# Patient Record
Sex: Male | Born: 1995
Health system: Southern US, Community
[De-identification: ages and names within clinical notes are randomized; demographics above are authoritative.]

## PROBLEM LIST (undated history)

## (undated) DIAGNOSIS — T7840XA Allergy, unspecified, initial encounter: Secondary | ICD-10-CM

## (undated) HISTORY — PX: HERNIA REPAIR: SHX51

## (undated) HISTORY — DX: Allergy, unspecified, initial encounter: T78.40XA

---
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
## 2015-05-19 ENCOUNTER — Other Ambulatory Visit (INDEPENDENT_AMBULATORY_CARE_PROVIDER_SITE_OTHER): Payer: 59

## 2015-05-19 ENCOUNTER — Encounter: Payer: Self-pay | Admitting: Internal Medicine

## 2015-05-19 ENCOUNTER — Ambulatory Visit (INDEPENDENT_AMBULATORY_CARE_PROVIDER_SITE_OTHER): Payer: 59 | Admitting: Internal Medicine

## 2015-05-19 VITALS — BP 118/58 | HR 76 | Temp 97.4°F | Resp 16 | Ht 69.0 in | Wt 150.0 lb

## 2015-05-19 DIAGNOSIS — R591 Generalized enlarged lymph nodes: Secondary | ICD-10-CM | POA: Insufficient documentation

## 2015-05-19 DIAGNOSIS — Z23 Encounter for immunization: Secondary | ICD-10-CM

## 2015-05-19 DIAGNOSIS — Z111 Encounter for screening for respiratory tuberculosis: Secondary | ICD-10-CM | POA: Diagnosis not present

## 2015-05-19 DIAGNOSIS — Z0000 Encounter for general adult medical examination without abnormal findings: Secondary | ICD-10-CM

## 2015-05-19 LAB — CBC WITH DIFFERENTIAL/PLATELET
BASOS PCT: 0.2 % (ref 0.0–3.0)
Basophils Absolute: 0 10*3/uL (ref 0.0–0.1)
EOS ABS: 0.1 10*3/uL (ref 0.0–0.7)
EOS PCT: 1.6 % (ref 0.0–5.0)
HEMATOCRIT: 48.6 % (ref 39.0–52.0)
Hemoglobin: 16.6 g/dL (ref 13.0–17.0)
Lymphocytes Relative: 19.7 % (ref 12.0–46.0)
Lymphs Abs: 1.8 10*3/uL (ref 0.7–4.0)
MCHC: 34.1 g/dL (ref 30.0–36.0)
MCV: 92 fl (ref 78.0–100.0)
MONO ABS: 0.8 10*3/uL (ref 0.1–1.0)
Monocytes Relative: 8.7 % (ref 3.0–12.0)
NEUTROS ABS: 6.2 10*3/uL (ref 1.4–7.7)
Neutrophils Relative %: 69.8 % (ref 43.0–77.0)
PLATELETS: 154 10*3/uL (ref 150.0–400.0)
RBC: 5.28 Mil/uL (ref 4.22–5.81)
RDW: 12.9 % (ref 11.5–15.5)
WBC: 8.9 10*3/uL (ref 4.0–10.5)

## 2015-05-19 LAB — COMPREHENSIVE METABOLIC PANEL
ALBUMIN: 4.6 g/dL (ref 3.5–5.2)
ALT: 13 U/L (ref 0–53)
AST: 17 U/L (ref 0–37)
Alkaline Phosphatase: 69 U/L (ref 39–117)
BUN: 15 mg/dL (ref 6–23)
CHLORIDE: 102 meq/L (ref 96–112)
CO2: 30 meq/L (ref 19–32)
CREATININE: 1.17 mg/dL (ref 0.40–1.50)
Calcium: 9.8 mg/dL (ref 8.4–10.5)
GFR: 83.38 mL/min (ref >60.00)
Glucose, Bld: 100 mg/dL — ABNORMAL HIGH (ref 70–99)
Potassium: 4 mEq/L (ref 3.5–5.1)
SODIUM: 139 meq/L (ref 135–145)
Total Bilirubin: 0.9 mg/dL (ref 0.2–1.2)
Total Protein: 7 g/dL (ref 6.0–8.3)

## 2015-05-19 LAB — URINALYSIS, ROUTINE W REFLEX MICROSCOPIC
Bilirubin Urine: NEGATIVE
HGB URINE DIPSTICK: NEGATIVE
Ketones, ur: NEGATIVE
Leukocytes, UA: NEGATIVE
NITRITE: NEGATIVE
RBC / HPF: NONE SEEN (ref 0–?)
Total Protein, Urine: NEGATIVE
URINE GLUCOSE: NEGATIVE
Urobilinogen, UA: 0.2 (ref 0.0–1.0)
WBC, UA: NONE SEEN (ref 0–?)
pH: 6 (ref 5.0–8.0)

## 2015-05-19 LAB — LIPID PANEL
CHOLESTEROL: 169 mg/dL (ref 0–200)
HDL: 60.4 mg/dL (ref >39.00)
LDL CALC: 82 mg/dL (ref 0–99)
NonHDL: 108.81
Total CHOL/HDL Ratio: 3
Triglycerides: 133 mg/dL (ref 0.0–149.0)
VLDL: 26.6 mg/dL (ref 0.0–40.0)

## 2015-05-19 LAB — SEDIMENTATION RATE: SED RATE: 5 mm/h (ref 0–22)

## 2015-05-19 LAB — TSH: TSH: 4.52 u[IU]/mL — AB (ref 0.35–4.50)

## 2015-05-19 NOTE — Patient Instructions (Signed)

## 2015-05-19 NOTE — Progress Notes (Signed)
Subjective:  Patient ID: [name], male    DOB: Aug 01, 1995  Age: 19 y.o. MRN: [id number]  CC: Annual Exam   HPI DANYAL WHITENACK presents for a CPX - he feels well and offers no complaints.  No outpatient prescriptions prior to visit.   No facility-administered medications prior to visit.    ROS Review of Systems  Constitutional: Negative.  Negative for fever, chills, diaphoresis, appetite change and fatigue.  HENT: Negative.  Negative for sore throat and trouble swallowing.   Eyes: Negative.   Respiratory: Negative.  Negative for cough, choking, chest tightness, shortness of breath and stridor.   Cardiovascular: Negative.  Negative for chest pain, palpitations and leg swelling.  Gastrointestinal: Negative.  Negative for nausea, vomiting, abdominal pain, diarrhea, constipation and blood in stool.  Endocrine: Negative.   Genitourinary: Negative.  Negative for dysuria, urgency, frequency, hematuria, flank pain, decreased urine volume, enuresis, difficulty urinating, genital sores, penile pain and testicular pain.  Musculoskeletal: Negative.  Negative for myalgias, back pain, joint swelling and arthralgias.  Skin: Negative.  Negative for rash.  Allergic/Immunologic: Negative.   Neurological: Negative.  Negative for dizziness, tremors, syncope, light-headedness, numbness and headaches.  Hematological: Negative.  Negative for adenopathy. Does not bruise/bleed easily.  Psychiatric/Behavioral: Negative.     Objective:  BP 118/58 mmHg  Pulse 76  Temp(Src) 97.4 F (36.3 C) (Oral)  Resp 16  Ht  (1.753 m)  Wt 150 lb (68.04 kg)  BMI 22.14 kg/m2  SpO2 98%  BP Readings from Last 3 Encounters:  05/19/15 118/58    Wt Readings from Last 3 Encounters:  05/19/15 150 lb (68.04 kg)    Physical Exam  Constitutional: He is oriented to person, place, and time. No distress.  HENT:  Head: Normocephalic and atraumatic.  Mouth/Throat: Oropharynx is clear and moist. No  oropharyngeal exudate.  Eyes: Conjunctivae are normal. Right eye exhibits no discharge. Left eye exhibits no discharge. No scleral icterus.  Neck: Normal range of motion. Neck supple. No JVD present. No tracheal deviation present. No thyromegaly present.  Cardiovascular: Normal rate, regular rhythm, normal heart sounds and intact distal pulses.  Exam reveals no gallop and no friction rub.   No murmur heard. Pulmonary/Chest: Effort normal and breath sounds normal. No stridor. No respiratory distress. He has no wheezes. He has no rales. He exhibits no tenderness.  Abdominal: Soft. Bowel sounds are normal. He exhibits no distension and no mass. There is no tenderness. There is no rebound and no guarding. Hernia confirmed negative in the right inguinal area and confirmed negative in the left inguinal area.  Genitourinary: Testes normal and penis normal. Right testis shows no mass, no swelling and no tenderness. Right testis is descended. Left testis shows no mass, no swelling and no tenderness. Left testis is descended. Circumcised. No penile erythema or penile tenderness. No discharge found.  Musculoskeletal: Normal range of motion. He exhibits no edema or tenderness.  Lymphadenopathy:       Head (right side): No submental, no submandibular, no tonsillar, no preauricular, no posterior auricular and no occipital adenopathy present.       Head (left side): No submental, no submandibular, no tonsillar, no preauricular, no posterior auricular and no occipital adenopathy present.    He has no cervical adenopathy.       Right cervical: No superficial cervical, no deep cervical and no posterior cervical adenopathy present.      Left cervical: No superficial cervical, no deep cervical and no posterior cervical  adenopathy present.    He has axillary adenopathy.       Right axillary: Lateral adenopathy present. No pectoral adenopathy present.       Left axillary: No pectoral and no lateral adenopathy present.       Right: Inguinal adenopathy present. No supraclavicular and no epitrochlear adenopathy present.       Left: Inguinal adenopathy present. No supraclavicular and no epitrochlear adenopathy present.  In the right axilla there is a 1.5 cm round, firm, freely mobile lymph node that is non-tender and non-fixed.  There is bilateral "shoddy" LAD in the bilateral inguinal areas that measures about 1/2 cm and is freely mobile and non-tender.  Neurological: He is oriented to person, place, and time.  Skin: Skin is warm and dry. No rash noted. He is not diaphoretic. No erythema. No pallor.  Vitals reviewed.   Lab Results  Component Value Date   WBC 8.9 05/19/2015   HGB 16.6 05/19/2015   HCT 48.6 05/19/2015   PLT 154.0 05/19/2015   GLUCOSE 100* 05/19/2015   CHOL 169 05/19/2015   TRIG 133.0 05/19/2015   HDL 60.40 05/19/2015   LDLCALC 82 05/19/2015   ALT 13 05/19/2015   AST 17 05/19/2015   NA 139 05/19/2015   K 4.0 05/19/2015   CL 102 05/19/2015   CREATININE 1.17 05/19/2015   BUN 15 05/19/2015   CO2 30 05/19/2015   TSH 4.52* 05/19/2015    Ct Pelvis W Contrast  08/12/2008  Clinical Data:  Abdominal pain, pressure on urination, history of ruptured appendicitis as well as hernia repair  CT ABDOMEN AND PELVIS WITH CONTRAST  Technique: Multidetector CT imaging of the abdomen and pelvis was performed using the standard protocol following bolus administration of intravenous contrast.  Contrast: 100 ml Optiray 358  Comparison: Chest and abdomen films of 05/16/2006  CT ABDOMEN  Findings:  The lung bases are clear.  The liver enhances with no focal abnormality and no ductal dilatation is seen.  No calcified gallstones are noted.  The stomach is somewhat distended by fluid, a finding of questionable significance.  The pancreas is normal in size and the pancreatic duct is not dilated.  The adrenal glands appear normal and the spleen is minimally prominent.  The kidneys enhance with no evidence of calculi or  hydronephrosis.  The abdominal aorta is normal in caliber.  There is a moderate amount of feces throughout the entire colon.  IMPRESSION:  1.  No acute abnormality on CT of the abdomen. 2.  Fluid-distended stomach of questionable significance. 3.  The spleen is within upper limits of normal. 4.  Moderate amount of feces throughout the entire colon.  CT PELVIS  Findings:  Again there is a moderate amount of feces throughout the colon.  The urinary bladder is somewhat urine distended.  No intraluminal bladder lesion is seen.  Surgical clips lie just posterior to the urinary bladder between the rectum and the bladder.  No pelvic mass, fluid, or adenopathy is seen.  The terminal ileum appears normal.  Surgical clips are noted near the base of the cecum secondary to prior appendectomy.  IMPRESSION:  1.  Moderate amount of feces throughout the colon. 2.  The urinary bladder is somewhat distended. 3.  No acute abnormality.  Dr. Caleen Jobs was in surgery at the time of interpretation, and this study will be discussed after his completion of that surgery. Provider: Lanny Hurst, Elizabeth Soots  Ct Abdomen W Contrast  08/12/2008  Clinical Data:  Abdominal  pain, pressure on urination, history of ruptured appendicitis as well as hernia repair  CT ABDOMEN AND PELVIS WITH CONTRAST  Technique: Multidetector CT imaging of the abdomen and pelvis was performed using the standard protocol following bolus administration of intravenous contrast.  Contrast: 100 ml Optiray 358  Comparison: Chest and abdomen films of 05/16/2006  CT ABDOMEN  Findings:  The lung bases are clear.  The liver enhances with no focal abnormality and no ductal dilatation is seen.  No calcified gallstones are noted.  The stomach is somewhat distended by fluid, a finding of questionable significance.  The pancreas is normal in size and the pancreatic duct is not dilated.  The adrenal glands appear normal and the spleen is minimally prominent.  The kidneys enhance  with no evidence of calculi or hydronephrosis.  The abdominal aorta is normal in caliber.  There is a moderate amount of feces throughout the entire colon.  IMPRESSION:  1.  No acute abnormality on CT of the abdomen. 2.  Fluid-distended stomach of questionable significance. 3.  The spleen is within upper limits of normal. 4.  Moderate amount of feces throughout the entire colon.  CT PELVIS  Findings:  Again there is a moderate amount of feces throughout the colon.  The urinary bladder is somewhat urine distended.  No intraluminal bladder lesion is seen.  Surgical clips lie just posterior to the urinary bladder between the rectum and the bladder.  No pelvic mass, fluid, or adenopathy is seen.  The terminal ileum appears normal.  Surgical clips are noted near the base of the cecum secondary to prior appendectomy.  IMPRESSION:  1.  Moderate amount of feces throughout the colon. 2.  The urinary bladder is somewhat distended. 3.  No acute abnormality.  Dr. Caleen JobsFarouqi was in surgery at the time of interpretation, and this study will be discussed after his completion of that surgery. Provider: Lanny HurstMegan Smith, Gaynelle CageElizabeth Soots   Assessment & Plan:   Chrissie NoaWilliam was seen today for annual exam.  Diagnoses and all orders for this visit:  Lymphadenopathy, generalized- he has no signs or symptoms related to this and the lymph nodes feel benign. Will check a CBC, sedimentation rate, serum protein electrophoresis and HIV antibody level to see if there is concern for infection and/or lymphoproliferative disease. -     Sedimentation rate; Future -     SPEP & IFE with QIG; Future -     HIV antibody; Future  Routine general medical examination at a health care facility- exam done, TB test done at his request, vaccines were reviewed and updated, labs ordered, pt ed material was given -     Lipid panel; Future -     Comprehensive metabolic panel; Future -     CBC with Differential/Platelet; Future -     TSH; Future -      Urinalysis, Routine w reflex microscopic (not at [facility]); Future  Screening for tuberculosis -     PPD  Need for influenza vaccination -     Flu Vaccine QUAD 36+ mos IM   I am having Mr. Rigel maintain his loratadine.  Meds ordered this encounter  Medications  . loratadine (CLARITIN) 10 MG tablet    Sig: Take 10 mg by mouth daily as needed for allergies.     Follow-up: Return in about 4 weeks (around 06/16/2015).  Sanda Lingerhomas Yarianna Varble, MD

## 2015-05-20 ENCOUNTER — Encounter: Payer: Self-pay | Admitting: Internal Medicine

## 2015-05-20 LAB — HIV ANTIBODY (ROUTINE TESTING W REFLEX): HIV: NONREACTIVE

## 2015-05-21 ENCOUNTER — Encounter: Payer: Self-pay | Admitting: Internal Medicine

## 2015-05-21 ENCOUNTER — Telehealth: Payer: Self-pay

## 2015-05-21 LAB — TB SKIN TEST
INDURATION: 0 mm
TB SKIN TEST: NEGATIVE

## 2015-05-21 LAB — SPEP & IFE WITH QIG
ALBUMIN ELP: 5.2 g/dL — AB (ref 3.8–4.8)
ALPHA-1-GLOBULIN: 0.3 g/dL (ref 0.2–0.3)
Alpha-2-Globulin: 0.6 g/dL (ref 0.5–0.9)
BETA 2: 0.3 g/dL (ref 0.2–0.5)
BETA GLOBULIN: 0.4 g/dL (ref 0.4–0.6)
GAMMA GLOBULIN: 1 g/dL (ref 0.8–1.7)
IGG (IMMUNOGLOBIN G), SERUM: 1110 mg/dL (ref 650–1600)
IgA: 212 mg/dL (ref 68–379)
IgM, Serum: 46 mg/dL (ref 41–251)
Total Protein, Serum Electrophoresis: 7.7 g/dL (ref 6.1–8.1)

## 2015-06-30 NOTE — Telephone Encounter (Signed)
Erroneous

## 2015-08-14 ENCOUNTER — Encounter: Payer: Self-pay | Admitting: Internal Medicine

## 2015-08-14 ENCOUNTER — Ambulatory Visit (INDEPENDENT_AMBULATORY_CARE_PROVIDER_SITE_OTHER): Payer: 59 | Admitting: Internal Medicine

## 2015-08-14 VITALS — BP 120/60 | HR 75 | Temp 98.6°F | Resp 16 | Ht 68.0 in | Wt 151.0 lb

## 2015-08-14 DIAGNOSIS — B349 Viral infection, unspecified: Secondary | ICD-10-CM | POA: Diagnosis not present

## 2015-08-14 MED ORDER — FLUTICASONE PROPIONATE 50 MCG/ACT NA SUSP: 2.0000 | Freq: Every day | NASAL | Status: DC

## 2015-08-14 NOTE — Patient Instructions (Signed)
We have sent in flonase which is a nose spray that helps to dry up the sinuses. Use 2 sprays in each nostril once a day. It may take 2-3 days to kick in to full effect.  Keep using the ibuprofen or tylenol as needed for any fevers.   We have given you a note for work for tomorrow but you should be able to go Saturday.  Upper Respiratory Infection, Adult Most upper respiratory infections (URIs) are a viral infection of the air passages leading to the lungs. A URI affects the nose, throat, and upper air passages. The most common type of URI is nasopharyngitis and is typically referred to as "the common cold." URIs run their course and usually go away on their own. Most of the time, a URI does not require medical attention, but sometimes a bacterial infection in the upper airways can follow a viral infection. This is called a secondary infection. Sinus and middle ear infections are common types of secondary upper respiratory infections. Bacterial pneumonia can also complicate a URI. A URI can worsen asthma and chronic obstructive pulmonary disease (COPD). Sometimes, these complications can require emergency medical care and may be life threatening.  CAUSES Almost all URIs are caused by viruses. A virus is a type of germ and can spread from one person to another.  RISKS FACTORS You may be at risk for a URI if:   You smoke.   You have chronic heart or lung disease.  You have a weakened defense (immune) system.   You are very young or very old.   You have nasal allergies or asthma.  You work in crowded or poorly ventilated areas.  You work in health care facilities or schools. SIGNS AND SYMPTOMS  Symptoms typically develop 2-3 days after you come in contact with a cold virus. Most viral URIs last 7-10 days. However, viral URIs from the influenza virus (flu virus) can last 14-18 days and are typically more severe. Symptoms may include:   Runny or stuffy (congested) nose.   Sneezing.    Cough.   Sore throat.   Headache.   Fatigue.   Fever.   Loss of appetite.   Pain in your forehead, behind your eyes, and over your cheekbones (sinus pain).  Muscle aches.  DIAGNOSIS  Your health care provider may diagnose a URI by:  Physical exam.  Tests to check that your symptoms are not due to another condition such as:  Strep throat.  Sinusitis.  Pneumonia.  Asthma. TREATMENT  A URI goes away on its own with time. It cannot be cured with medicines, but medicines may be prescribed or recommended to relieve symptoms. Medicines may help:  Reduce your fever.  Reduce your cough.  Relieve nasal congestion. HOME CARE INSTRUCTIONS   Take medicines only as directed by your health care provider.   Gargle warm saltwater or take cough drops to comfort your throat as directed by your health care provider.  Use a warm mist humidifier or inhale steam from a shower to increase air moisture. This may make it easier to breathe.  Drink enough fluid to keep your urine clear or pale yellow.   Eat soups and other clear broths and maintain good nutrition.   Rest as needed.   Return to work when your temperature has returned to normal or as your health care provider advises. You may need to stay home longer to avoid infecting others. You can also use a face mask and careful hand washing  to prevent spread of the virus.  Increase the usage of your inhaler if you have asthma.   Do not use any tobacco products, including cigarettes, chewing tobacco, or electronic cigarettes. If you need help quitting, ask your health care provider. PREVENTION  The best way to protect yourself from getting a cold is to practice good hygiene.   Avoid oral or hand contact with people with cold symptoms.   Wash your hands often if contact occurs.  There is no clear evidence that vitamin C, vitamin E, echinacea, or exercise reduces the chance of developing a cold. However, it is  always recommended to get plenty of rest, exercise, and practice good nutrition.  SEEK MEDICAL CARE IF:   You are getting worse rather than better.   Your symptoms are not controlled by medicine.   You have chills.  You have worsening shortness of breath.  You have brown or red mucus.  You have yellow or brown nasal discharge.  You have pain in your face, especially when you bend forward.  You have a fever.  You have swollen neck glands.  You have pain while swallowing.  You have white areas in the back of your throat. SEEK IMMEDIATE MEDICAL CARE IF:   You have severe or persistent:  Headache.  Ear pain.  Sinus pain.  Chest pain.  You have chronic lung disease and any of the following:  Wheezing.  Prolonged cough.  Coughing up blood.  A change in your usual mucus.  You have a stiff neck.  You have changes in your:  Vision.  Hearing.  Thinking.  Mood. MAKE SURE YOU:   Understand these instructions.  Will watch your condition.  Will get help right away if you are not doing well or get worse.   This information is not intended to replace advice given to you by your health care provider. Make sure you discuss any questions you have with your health care provider.   Document Released: 12/22/2000 Document Revised: 11/12/2014 Document Reviewed: 10/03/2013 Elsevier Interactive Patient Education [employer]

## 2015-08-14 NOTE — Progress Notes (Signed)
Pre visit review using our clinic review tool, if applicable. No additional management support is needed unless otherwise documented below in the visit note. 

## 2015-08-15 ENCOUNTER — Encounter: Payer: Self-pay | Admitting: Internal Medicine

## 2015-08-15 DIAGNOSIS — B349 Viral infection, unspecified: Secondary | ICD-10-CM | POA: Insufficient documentation

## 2015-08-15 NOTE — Assessment & Plan Note (Signed)
Rx for flonase to help with the nasal symptoms. He is okay to use sudafed. Note for work to miss Friday shift. If no more fevers can go to work on Saturday. No antibiotics indicated and not typical for influenza.

## 2015-08-15 NOTE — Progress Notes (Signed)
   Subjective:    Patient ID: Harold Nichols, male    DOB: 08-22-1995, 20 y.o.   MRN: [id number]  HPI The patient is a 20 YO man coming in for viral symptoms. He has had some fevers over the last week. Treating with ibuprofen. Denies any muscle aches or fatigue. Is slightly tired. Some sinus congestion which he has treated with sudafed. Some improvement there. Mild cough but no SOB and no sputum. No sinus pressure or headache. Has not tried any other medicines.   Review of Systems  Constitutional: Positive for fever, chills and activity change. Negative for appetite change, fatigue and unexpected weight change.  HENT: Positive for congestion and rhinorrhea. Negative for ear discharge, ear pain, postnasal drip, sinus pressure, sore throat and trouble swallowing.   Eyes: Negative.   Respiratory: Positive for cough. Negative for chest tightness, shortness of breath and wheezing.   Cardiovascular: Negative for chest pain, palpitations and leg swelling.  Gastrointestinal: Negative.   Musculoskeletal: Negative.   Neurological: Negative.       Objective:   Physical Exam  Constitutional: He appears well-developed and well-nourished.  HENT:  Head: Normocephalic and atraumatic.  TMs normal, throat with mild erythema and clear drainage, nasal turbinates without crusting.  Eyes: EOM are normal.  Neck: Normal range of motion.  Cardiovascular: Normal rate and regular rhythm.   Pulmonary/Chest: Effort normal and breath sounds normal. No respiratory distress. He has no wheezes. He has no rales.  Abdominal: Soft.  Neurological: Coordination normal.  Skin: Skin is warm and dry.   Filed Vitals:   08/14/15 1614  BP: 120/60  Pulse: 75  Temp: 98.6 F (37 C)  TempSrc: Oral  Resp: 16  Height:  (1.727 m)  Weight: 151 lb (68.493 kg)  SpO2: 98%      Assessment & Plan:

## 2016-07-01 ENCOUNTER — Encounter: Payer: Self-pay | Admitting: Internal Medicine

## 2016-07-01 ENCOUNTER — Ambulatory Visit (INDEPENDENT_AMBULATORY_CARE_PROVIDER_SITE_OTHER): Payer: 59 | Admitting: Internal Medicine

## 2016-07-01 VITALS — BP 115/56 | HR 86 | Temp 98.4°F | Resp 16 | Ht 69.0 in | Wt 150.0 lb

## 2016-07-01 DIAGNOSIS — Z23 Encounter for immunization: Secondary | ICD-10-CM

## 2016-07-01 DIAGNOSIS — M7652 Patellar tendinitis, left knee: Secondary | ICD-10-CM | POA: Diagnosis not present

## 2016-07-01 MED ORDER — DICLOFENAC POTASSIUM 25 MG PO CAPS: 1.0000 | ORAL_CAPSULE | Freq: Three times a day (TID) | ORAL | 0 refills | Status: DC

## 2016-07-01 NOTE — Progress Notes (Signed)
Pre visit review using our clinic review tool, if applicable. No additional management support is needed unless otherwise documented below in the visit note. 

## 2016-07-01 NOTE — Patient Instructions (Signed)
Patellar Tendinitis Patellar tendinitis, also called jumper's knee, is inflammation of the patellar tendon. Tendons are cord-like tissues that connect muscles to bones. The patellar tendon connects the bottom of the kneecap (patella) to the top of the shin bone (tibia). Patellar tendinitis causes pain in the front of the knee. The condition happens in the following stages:  Stage 1. In this stage, there is pain only after activity.  Stage 2: In this stage, you have pain during and after activity.  Stage 3: In this stage, you have pain during and after activity that limits your ability to do the activity.  Stage 4: In this stage, the tendon tears and severely limits your activity. What are the causes? This condition is caused by repeated (repetitive) stress on the tendon. This stress may cause the tendon to stretch, swell, thicken, or tear. What increases the risk? The following factors make you more likely to develop this condition:  Participating in sports that involve running, kicking and jumping, especially on hard surfaces. These include:  Basketball.  Volleyball.  Soccer.  Track and field.  Having tight thigh muscles.  Having received steroid injections in the tendon.  Having had knee surgery.  Being 16-40 years old.  Having rheumatoid arthritis or diabetes.  Training too hard. What are the signs or symptoms? The main symptom of this condition is pain in the front of the knee. The pain usually starts slowly then it gradually gets worse. It may become painful to straighten your leg. How is this diagnosed? This condition may be diagnosed based on:  Your symptoms.  A medical history.  A physical exam. During the physical exam, your health care provider may check for tenderness in your patella, tightness in your thigh muscles, and pain when you straighten your knee.  Imaging tests, including:  X-rays. These will show the position and condition of your  patella.  MRI. This will show any tears in your tendon.  Ultrasound. This will show any swelling in your tendon and the thickness of your tendon. How is this treated? Treatment for this condition depends on the stage of the condition. It may involve:  Avoiding activities that cause pain.  Icing your knee.  Taking an NSAID to reduce pain and swelling.  Doing stretching and strengthening exercises (physical therapy) when pain and swelling improve.  Having sound wave stimulation to promote healing.  Wearing a knee brace. This may be needed if your condition does not improve with treatment.  Using crutches or a walker. This may be needed if your condition does not improve with treatment.  Surgery. This may be done if you have stage 4 tendinitis. Follow these instructions at home: If You Have a Knee Brace:  Wear it as told by your health care provider. Remove it only as told by your health care provider.  Loosen the brace if your toes become numb and tingle, or if they turn cold and blue.  Do not let your brace get wet if it is not waterproof.  Keep the brace clean.  Ask your health care provider when it is safe for you to drive. Managing pain, stiffness, and swelling  Take over-the-counter and prescription medicines only as told by your health care provider.  If directed, apply ice to the injured area.  Put ice in a plastic bag.  Place a towel between your skin and the bag.  Leave the ice on for 20 minutes, 2-3 times a day.  Move your toes often to avoid stiffness   and to lessen swelling.  Raise (elevate) your knee above the level of your heart while you are sitting or lying down. Activity  Return to your normal activities as told by your health care provider. Ask your health care provider what activities are safe for you.  Do exercises as told by your health care provider. General instructions  Do not use the injured limb to support your body weight until your  health care provider says that you can. Use your crutches or walker as told by your health care provider.  Keep all follow-up visits as told by your health care provider. This is important. How is this prevented?  Warm up and stretch before being active.  Cool down and stretch after being active.  Give your body time to rest between periods of activity.  Make sure to use equipment that fits you.  Be safe and responsible while being active to avoid falls.  Do at least 150 minutes of moderate-intensity exercise each week, such as brisk walking or water aerobics.  Maintain physical fitness, including:  Strength.  Flexibility.  Cardiovascular fitness.  Endurance. Contact a health care provider if:  Your symptoms have not improve in 6 weeks.  Your symptoms get worse. This information is not intended to replace advice given to you by your health care provider. Make sure you discuss any questions you have with your health care provider. Document Released: 06/28/2005 Document Revised: 03/02/2016 Document Reviewed: 04/01/2015 Elsevier Interactive Patient Education  2017 [employer]  

## 2016-07-01 NOTE — Progress Notes (Signed)
Subjective:  Patient ID: Harold Nichols, male    DOB: 1995-12-25  Age: 20 y.o. MRN: [id number]  CC: Knee Pain   HPI MIKOLAJ WOOLSTENHULME presents for worsening knee pain over the last few weeks with no history of trauma or injury and no repetitive activities. He works as a Airline pilot. He complains of diffuse discomfort around his kneecap when he is climbing steps. He has not noticed any swelling, locking, clicking, instability, decreased range of motion, or redness. He is taking over-the-counter ibuprofen and has achieved moderate symptom relief.  Outpatient Medications Prior to Visit  Medication Sig Dispense Refill  . fluticasone (FLONASE) 50 MCG/ACT nasal spray Place 2 sprays into both nostrils daily. 16 g 6  . loratadine (CLARITIN) 10 MG tablet Take 10 mg by mouth daily as needed for allergies. Reported on 08/14/2015     No facility-administered medications prior to visit.     ROS Review of Systems  Musculoskeletal: Positive for arthralgias.  All other systems reviewed and are negative.   Objective:  BP (!) 115/56 (BP Location: Left Arm, Patient Position: Sitting, Cuff Size: Normal)   Pulse 86   Temp 98.4 F (36.9 C) (Oral)   Resp 16   Ht 5\' 9"  (1.753 m)   Wt 150 lb (68 kg)   SpO2 99%   BMI 22.15 kg/m   BP Readings from Last 3 Encounters:  07/01/16 (!) 115/56  08/14/15 120/60  05/19/15 (!) 118/58    Wt Readings from Last 3 Encounters:  07/01/16 150 lb (68 kg)  08/14/15 151 lb (68.5 kg)  05/19/15 150 lb (68 kg)    Physical Exam  Musculoskeletal:       Left knee: He exhibits normal range of motion, no swelling, no effusion, no ecchymosis, no deformity, no laceration, no erythema, normal alignment, no LCL laxity, no bony tenderness and normal meniscus. Tenderness found. Patellar tendon tenderness noted. No medial joint line, no lateral joint line, no MCL and no LCL tenderness noted.    Lab Results  Component Value Date   WBC 8.9 05/19/2015   HGB 16.6 05/19/2015   HCT  48.6 05/19/2015   PLT 154.0 05/19/2015   GLUCOSE 100 (H) 05/19/2015   CHOL 169 05/19/2015   TRIG 133.0 05/19/2015   HDL 60.40 05/19/2015   LDLCALC 82 05/19/2015   ALT 13 05/19/2015   AST 17 05/19/2015   NA 139 05/19/2015   K 4.0 05/19/2015   CL 102 05/19/2015   CREATININE 1.17 05/19/2015   BUN 15 05/19/2015   CO2 30 05/19/2015   TSH 4.52 (H) 05/19/2015    Ct Pelvis W Contrast  Result Date: 08/12/2008 Clinical Data:  Abdominal pain, pressure on urination, history of ruptured appendicitis as well as hernia repair  CT ABDOMEN AND PELVIS WITH CONTRAST  Technique: Multidetector CT imaging of the abdomen and pelvis was performed using the standard protocol following bolus administration of intravenous contrast.  Contrast: 100 ml Optiray 358  Comparison: Chest and abdomen films of 05/16/2006  CT ABDOMEN  Findings:  The lung bases are clear.  The liver enhances with no focal abnormality and no ductal dilatation is seen.  No calcified gallstones are noted.  The stomach is somewhat distended by fluid, a finding of questionable significance.  The pancreas is normal in size and the pancreatic duct is not dilated.  The adrenal glands appear normal and the spleen is minimally prominent.  The kidneys enhance with no evidence of calculi or hydronephrosis.  The abdominal aorta  is normal in caliber.  There is a moderate amount of feces throughout the entire colon.  IMPRESSION:  1.  No acute abnormality on CT of the abdomen. 2.  Fluid-distended stomach of questionable significance. 3.  The spleen is within upper limits of normal. 4.  Moderate amount of feces throughout the entire colon.  CT PELVIS  Findings:  Again there is a moderate amount of feces throughout the colon.  The urinary bladder is somewhat urine distended.  No intraluminal bladder lesion is seen.  Surgical clips lie just posterior to the urinary bladder between the rectum and the bladder.  No pelvic mass, fluid, or adenopathy is seen.  The terminal  ileum appears normal.  Surgical clips are noted near the base of the cecum secondary to prior appendectomy.  IMPRESSION:  1.  Moderate amount of feces throughout the colon. 2.  The urinary bladder is somewhat distended. 3.  No acute abnormality.  Dr. Caleen JobsFarouqi was in surgery at the time of interpretation, and this study will be discussed after his completion of that surgery. Provider: Lanny HurstMegan Smith, Gaynelle CageElizabeth Soots  Ct Abdomen W Contrast  Result Date: 08/12/2008 Clinical Data:  Abdominal pain, pressure on urination, history of ruptured appendicitis as well as hernia repair  CT ABDOMEN AND PELVIS WITH CONTRAST  Technique: Multidetector CT imaging of the abdomen and pelvis was performed using the standard protocol following bolus administration of intravenous contrast.  Contrast: 100 ml Optiray 358  Comparison: Chest and abdomen films of 05/16/2006  CT ABDOMEN  Findings:  The lung bases are clear.  The liver enhances with no focal abnormality and no ductal dilatation is seen.  No calcified gallstones are noted.  The stomach is somewhat distended by fluid, a finding of questionable significance.  The pancreas is normal in size and the pancreatic duct is not dilated.  The adrenal glands appear normal and the spleen is minimally prominent.  The kidneys enhance with no evidence of calculi or hydronephrosis.  The abdominal aorta is normal in caliber.  There is a moderate amount of feces throughout the entire colon.  IMPRESSION:  1.  No acute abnormality on CT of the abdomen. 2.  Fluid-distended stomach of questionable significance. 3.  The spleen is within upper limits of normal. 4.  Moderate amount of feces throughout the entire colon.  CT PELVIS  Findings:  Again there is a moderate amount of feces throughout the colon.  The urinary bladder is somewhat urine distended.  No intraluminal bladder lesion is seen.  Surgical clips lie just posterior to the urinary bladder between the rectum and the bladder.  No pelvic mass,  fluid, or adenopathy is seen.  The terminal ileum appears normal.  Surgical clips are noted near the base of the cecum secondary to prior appendectomy.  IMPRESSION:  1.  Moderate amount of feces throughout the colon. 2.  The urinary bladder is somewhat distended. 3.  No acute abnormality.  Dr. Caleen JobsFarouqi was in surgery at the time of interpretation, and this study will be discussed after his completion of that surgery. Provider: Lanny HurstMegan Smith, Gaynelle CageElizabeth Soots   Assessment & Plan:   Chrissie NoaWilliam was seen today for knee pain.  Diagnoses and all orders for this visit:  Need for prophylactic vaccination and inoculation against influenza -     Flu Vaccine QUAD 36+ mos PF IM (Fluarix & Fluzone Quad PF)  Patellar tendonitis of left knee- he was given a knee brace, will start prescription anti-inflammatories. He was given patient education regarding  exercise and other modalities to reduce the discomfort and strengthen his knee. -     Diclofenac Potassium (ZIPSOR) 25 MG CAPS; Take 1 capsule (25 mg total) by mouth 3 (three) times daily with meals.   I am having Mr. Heidelberger start on Diclofenac Potassium. I am also having him maintain his loratadine and fluticasone.  Meds ordered this encounter  Medications  . Diclofenac Potassium (ZIPSOR) 25 MG CAPS    Sig: Take 1 capsule (25 mg total) by mouth 3 (three) times daily with meals.    Dispense:  32 capsule    Refill:  0     Follow-up: Return in about 3 weeks (around 07/22/2016).  Sanda Lingerhomas Aleksis Jiggetts, MD

## 2016-07-22 ENCOUNTER — Ambulatory Visit: Payer: 59 | Admitting: Internal Medicine

## 2016-11-02 ENCOUNTER — Encounter: Payer: Self-pay | Admitting: Internal Medicine

## 2016-11-02 ENCOUNTER — Ambulatory Visit (INDEPENDENT_AMBULATORY_CARE_PROVIDER_SITE_OTHER): Payer: 59 | Admitting: Internal Medicine

## 2016-11-02 VITALS — BP 124/60 | HR 81 | Temp 98.0°F | Resp 16 | Ht 69.0 in | Wt 150.5 lb

## 2016-11-02 DIAGNOSIS — R7989 Other specified abnormal findings of blood chemistry: Secondary | ICD-10-CM

## 2016-11-02 DIAGNOSIS — Z111 Encounter for screening for respiratory tuberculosis: Secondary | ICD-10-CM | POA: Diagnosis not present

## 2016-11-02 DIAGNOSIS — Z0000 Encounter for general adult medical examination without abnormal findings: Secondary | ICD-10-CM

## 2016-11-02 NOTE — Progress Notes (Signed)
Pre visit review using our clinic review tool, if applicable. No additional management support is needed unless otherwise documented below in the visit note. 

## 2016-11-02 NOTE — Progress Notes (Signed)
Subjective:  Patient ID: Harold Nichols, male    DOB: 1996-01-03  Age: 21 y.o. MRN: [id number]  CC: Annual Exam   HPI Harold Nichols presents for a CPX - he has a hx of mildly elevated TSH but he feels well and offers no complaints.  Outpatient Medications Prior to Visit  Medication Sig Dispense Refill  . Diclofenac Potassium (ZIPSOR) 25 MG CAPS Take 1 capsule (25 mg total) by mouth 3 (three) times daily with meals. 32 capsule 0  . fluticasone (FLONASE) 50 MCG/ACT nasal spray Place 2 sprays into both nostrils daily. 16 g 6  . loratadine (CLARITIN) 10 MG tablet Take 10 mg by mouth daily as needed for allergies. Reported on 08/14/2015     No facility-administered medications prior to visit.     ROS Review of Systems  Constitutional: Negative.  Negative for activity change, appetite change, diaphoresis, fatigue and unexpected weight change.  HENT: Negative.   Eyes: Negative.   Respiratory: Negative.  Negative for cough, chest tightness, shortness of breath and wheezing.   Cardiovascular: Negative.  Negative for chest pain, palpitations and leg swelling.  Gastrointestinal: Negative for abdominal pain.  Endocrine: Negative for cold intolerance and heat intolerance.  Genitourinary: Negative for difficulty urinating, discharge, genital sores, penile swelling, scrotal swelling, testicular pain and urgency.  Musculoskeletal: Negative.  Negative for back pain and neck pain.  Skin: Negative.   Allergic/Immunologic: Negative.   Neurological: Negative.  Negative for dizziness, weakness and numbness.  Hematological: Negative for adenopathy. Does not bruise/bleed easily.  Psychiatric/Behavioral: Negative.     Objective:  BP 124/60 (BP Location: Left Arm, Patient Position: Sitting, Cuff Size: Normal)   Pulse 81   Temp 98 F (36.7 C) (Oral)   Resp 16   Ht  (1.753 m)   Wt 150 lb 8 oz (68.3 kg)   SpO2 100%   BMI 22.22 kg/m   BP Readings from Last 3 Encounters:  11/02/16 124/60    07/01/16 (!) 115/56  08/14/15 120/60    Wt Readings from Last 3 Encounters:  11/02/16 150 lb 8 oz (68.3 kg)  07/01/16 150 lb (68 kg)  08/14/15 151 lb (68.5 kg)    Physical Exam  Constitutional: He is oriented to person, place, and time. No distress.  HENT:  Mouth/Throat: Oropharynx is clear and moist. No oropharyngeal exudate.  Eyes: Conjunctivae are normal. Right eye exhibits no discharge. Left eye exhibits no discharge. No scleral icterus.  Neck: Normal range of motion. Neck supple. No JVD present. No tracheal deviation present. No thyromegaly present.  Cardiovascular: Normal rate, regular rhythm, normal heart sounds and intact distal pulses.  Exam reveals no gallop and no friction rub.   No murmur heard. Pulmonary/Chest: Effort normal and breath sounds normal. No stridor. No respiratory distress. He has no wheezes. He has no rales. He exhibits no tenderness.  Abdominal: Soft. Bowel sounds are normal. He exhibits no distension and no mass. There is no tenderness. There is no rebound and no guarding. Hernia confirmed negative in the right inguinal area and confirmed negative in the left inguinal area.  Genitourinary: Testes normal and penis normal. Right testis shows no mass, no swelling and no tenderness. Right testis is descended. Left testis shows no mass, no swelling and no tenderness. Left testis is descended. Circumcised. No penile erythema or penile tenderness. No discharge found.  Musculoskeletal: Normal range of motion. He exhibits no edema, tenderness or deformity.  Lymphadenopathy:    He has no cervical adenopathy.  Right: No inguinal adenopathy present.       Left: No inguinal adenopathy present.  Neurological: He is oriented to person, place, and time.  Skin: Skin is warm and dry. No rash noted. He is not diaphoretic. No erythema. No pallor.  Vitals reviewed.   Lab Results  Component Value Date   WBC 8.9 05/19/2015   HGB 16.6 05/19/2015   HCT 48.6 05/19/2015    PLT 154.0 05/19/2015   GLUCOSE 100 (H) 05/19/2015   CHOL 169 05/19/2015   TRIG 133.0 05/19/2015   HDL 60.40 05/19/2015   LDLCALC 82 05/19/2015   ALT 13 05/19/2015   AST 17 05/19/2015   NA 139 05/19/2015   K 4.0 05/19/2015   CL 102 05/19/2015   CREATININE 1.17 05/19/2015   BUN 15 05/19/2015   CO2 30 05/19/2015   TSH 4.52 (H) 05/19/2015    Ct Pelvis W Contrast  Result Date: 08/12/2008 Clinical Data:  Abdominal pain, pressure on urination, history of ruptured appendicitis as well as hernia repair  CT ABDOMEN AND PELVIS WITH CONTRAST  Technique: Multidetector CT imaging of the abdomen and pelvis was performed using the standard protocol following bolus administration of intravenous contrast.  Contrast: 100 ml Optiray 358  Comparison: Chest and abdomen films of 05/16/2006  CT ABDOMEN  Findings:  The lung bases are clear.  The liver enhances with no focal abnormality and no ductal dilatation is seen.  No calcified gallstones are noted.  The stomach is somewhat distended by fluid, a finding of questionable significance.  The pancreas is normal in size and the pancreatic duct is not dilated.  The adrenal glands appear normal and the spleen is minimally prominent.  The kidneys enhance with no evidence of calculi or hydronephrosis.  The abdominal aorta is normal in caliber.  There is a moderate amount of feces throughout the entire colon.  IMPRESSION:  1.  No acute abnormality on CT of the abdomen. 2.  Fluid-distended stomach of questionable significance. 3.  The spleen is within upper limits of normal. 4.  Moderate amount of feces throughout the entire colon.  CT PELVIS  Findings:  Again there is a moderate amount of feces throughout the colon.  The urinary bladder is somewhat urine distended.  No intraluminal bladder lesion is seen.  Surgical clips lie just posterior to the urinary bladder between the rectum and the bladder.  No pelvic mass, fluid, or adenopathy is seen.  The terminal ileum appears normal.   Surgical clips are noted near the base of the cecum secondary to prior appendectomy.  IMPRESSION:  1.  Moderate amount of feces throughout the colon. 2.  The urinary bladder is somewhat distended. 3.  No acute abnormality.  Dr. Caleen Jobs was in surgery at the time of interpretation, and this study will be discussed after his completion of that surgery. Provider: Lanny Hurst, Gaynelle Cage  Ct Abdomen W Contrast  Result Date: 08/12/2008 Clinical Data:  Abdominal pain, pressure on urination, history of ruptured appendicitis as well as hernia repair  CT ABDOMEN AND PELVIS WITH CONTRAST  Technique: Multidetector CT imaging of the abdomen and pelvis was performed using the standard protocol following bolus administration of intravenous contrast.  Contrast: 100 ml Optiray 358  Comparison: Chest and abdomen films of 05/16/2006  CT ABDOMEN  Findings:  The lung bases are clear.  The liver enhances with no focal abnormality and no ductal dilatation is seen.  No calcified gallstones are noted.  The stomach is somewhat distended by fluid,  a finding of questionable significance.  The pancreas is normal in size and the pancreatic duct is not dilated.  The adrenal glands appear normal and the spleen is minimally prominent.  The kidneys enhance with no evidence of calculi or hydronephrosis.  The abdominal aorta is normal in caliber.  There is a moderate amount of feces throughout the entire colon.  IMPRESSION:  1.  No acute abnormality on CT of the abdomen. 2.  Fluid-distended stomach of questionable significance. 3.  The spleen is within upper limits of normal. 4.  Moderate amount of feces throughout the entire colon.  CT PELVIS  Findings:  Again there is a moderate amount of feces throughout the colon.  The urinary bladder is somewhat urine distended.  No intraluminal bladder lesion is seen.  Surgical clips lie just posterior to the urinary bladder between the rectum and the bladder.  No pelvic mass, fluid, or adenopathy is  seen.  The terminal ileum appears normal.  Surgical clips are noted near the base of the cecum secondary to prior appendectomy.  IMPRESSION:  1.  Moderate amount of feces throughout the colon. 2.  The urinary bladder is somewhat distended. 3.  No acute abnormality.  Dr. Caleen Jobs was in surgery at the time of interpretation, and this study will be discussed after his completion of that surgery. Provider: Lanny Hurst, Gaynelle Cage   Assessment & Plan:   Harold was seen today for annual exam.  Diagnoses and all orders for this visit:  TSH elevation- he appears euthyroid, will monitor for thyroid disease -     Thyroid Panel With TSH; Future -     Thyroid peroxidase antibody; Future  Routine general medical examination at a health care facility- exam completed, labs ordered, vaccines reviewed, pt ed material was given -     Comprehensive metabolic panel; Future -     CBC with Differential/Platelet; Future -     Lipid panel; Future  Screening-pulmonary TB -     PPD   I am having Harold Nichols maintain his loratadine, fluticasone, and Diclofenac Potassium.  No orders of the defined types were placed in this encounter.    Follow-up: Return in about 6 months (around 05/04/2017).  Sanda Linger, MD

## 2016-11-02 NOTE — Patient Instructions (Signed)
 Health Maintenance, Male A healthy lifestyle and preventive care is important for your health and wellness. Ask your health care provider about what schedule of regular examinations is right for you. What should I know about weight and diet?  Eat a Healthy Diet  Eat plenty of vegetables, fruits, whole grains, low-fat dairy products, and lean protein.  Do not eat a lot of foods high in solid fats, added sugars, or salt. Maintain a Healthy Weight  Regular exercise can help you achieve or maintain a healthy weight. You should:  Do at least 150 minutes of exercise each week. The exercise should increase your heart rate and make you sweat (moderate-intensity exercise).  Do strength-training exercises at least twice a week. Watch Your Levels of Cholesterol and Blood Lipids  Have your blood tested for lipids and cholesterol every 5 years starting at 23 years of age. If you are at high risk for heart disease, you should start having your blood tested when you are 23 years old. You may need to have your cholesterol levels checked more often if:  Your lipid or cholesterol levels are high.  You are older than 23 years of age.  You are at high risk for heart disease. What should I know about cancer screening? Many types of cancers can be detected early and may often be prevented. Lung Cancer  You should be screened every year for lung cancer if:  You are a current smoker who has smoked for at least 30 years.  You are a former smoker who has quit within the past 15 years.  Talk to your health care provider about your screening options, when you should start screening, and how often you should be screened. Colorectal Cancer  Routine colorectal cancer screening usually begins at 23 years of age and should be repeated every 5-10 years until you are 23 years old. You may need to be screened more often if early forms of precancerous polyps or small growths are found. Your health care provider  may recommend screening at an earlier age if you have risk factors for colon cancer.  Your health care provider may recommend using home test kits to check for hidden blood in the stool.  A small camera at the end of a tube can be used to examine your colon (sigmoidoscopy or colonoscopy). This checks for the earliest forms of colorectal cancer. Prostate and Testicular Cancer  Depending on your age and overall health, your health care provider may do certain tests to screen for prostate and testicular cancer.  Talk to your health care provider about any symptoms or concerns you have about testicular or prostate cancer. Skin Cancer  Check your skin from head to toe regularly.  Tell your health care provider about any new moles or changes in moles, especially if:  There is a change in a mole's size, shape, or color.  You have a mole that is larger than a pencil eraser.  Always use sunscreen. Apply sunscreen liberally and repeat throughout the day.  Protect yourself by wearing long sleeves, pants, a wide-brimmed hat, and sunglasses when outside. What should I know about heart disease, diabetes, and high blood pressure?  If you are 18-39 years of age, have your blood pressure checked every 3-5 years. If you are 40 years of age or older, have your blood pressure checked every year. You should have your blood pressure measured twice-once when you are at a hospital or clinic, and once when you are not at   a hospital or clinic. Record the average of the two measurements. To check your blood pressure when you are not at a hospital or clinic, you can use:  An automated blood pressure machine at a pharmacy.  A home blood pressure monitor.  Talk to your health care provider about your target blood pressure.  If you are between 45-79 years old, ask your health care provider if you should take aspirin to prevent heart disease.  Have regular diabetes screenings by checking your fasting blood sugar  level.  If you are at a normal weight and have a low risk for diabetes, have this test once every three years after the age of 45.  If you are overweight and have a high risk for diabetes, consider being tested at a younger age or more often.  A one-time screening for abdominal aortic aneurysm (AAA) by ultrasound is recommended for men aged 65-75 years who are current or former smokers. What should I know about preventing infection? Hepatitis B  If you have a higher risk for hepatitis B, you should be screened for this virus. Talk with your health care provider to find out if you are at risk for hepatitis B infection. Hepatitis C  Blood testing is recommended for:  Everyone born from 1945 through 1965.  Anyone with known risk factors for hepatitis C. Sexually Transmitted Diseases (STDs)  You should be screened each year for STDs including gonorrhea and chlamydia if:  You are sexually active and are younger than 24 years of age.  You are older than 24 years of age and your health care provider tells you that you are at risk for this type of infection.  Your sexual activity has changed since you were last screened and you are at an increased risk for chlamydia or gonorrhea. Ask your health care provider if you are at risk.  Talk with your health care provider about whether you are at high risk of being infected with HIV. Your health care provider may recommend a prescription medicine to help prevent HIV infection. What else can I do?  Schedule regular health, dental, and eye exams.  Stay current with your vaccines (immunizations).  Do not use any tobacco products, such as cigarettes, chewing tobacco, and e-cigarettes. If you need help quitting, ask your health care provider.  Limit alcohol intake to no more than 2 drinks per day. One drink equals 12 ounces of beer, 5 ounces of wine, or 1 ounces of hard liquor.  Do not use street drugs.  Do not share needles.  Ask your health  care provider for help if you need support or information about quitting drugs.  Tell your health care provider if you often feel depressed.  Tell your health care provider if you have ever been abused or do not feel safe at home. This information is not intended to replace advice given to you by your health care provider. Make sure you discuss any questions you have with your health care provider. Document Released: 12/25/2007 Document Revised: 02/25/2016 Document Reviewed: 04/01/2015 Elsevier Interactive Patient Education  2017 [employer]  

## 2016-11-03 ENCOUNTER — Encounter: Payer: Self-pay | Admitting: Internal Medicine

## 2016-11-04 ENCOUNTER — Encounter: Payer: Self-pay | Admitting: General Practice

## 2016-11-04 LAB — TB SKIN TEST
INDURATION: 0 mm
TB SKIN TEST: NEGATIVE

## 2016-11-16 ENCOUNTER — Ambulatory Visit (INDEPENDENT_AMBULATORY_CARE_PROVIDER_SITE_OTHER): Payer: 59 | Admitting: General Practice

## 2016-11-16 DIAGNOSIS — Z111 Encounter for screening for respiratory tuberculosis: Secondary | ICD-10-CM | POA: Diagnosis not present

## 2016-11-19 LAB — TB SKIN TEST
Induration: 0 mm
TB SKIN TEST: NEGATIVE

## 2017-01-25 ENCOUNTER — Telehealth: Payer: Self-pay

## 2017-01-25 ENCOUNTER — Ambulatory Visit: Payer: 59

## 2017-01-25 ENCOUNTER — Other Ambulatory Visit: Payer: 59

## 2017-01-25 DIAGNOSIS — Z0184 Encounter for antibody response examination: Secondary | ICD-10-CM

## 2017-01-25 DIAGNOSIS — Z789 Other specified health status: Secondary | ICD-10-CM

## 2017-01-25 NOTE — Telephone Encounter (Signed)
Error

## 2017-01-25 NOTE — Telephone Encounter (Signed)
Per dr Ronnald Ramp, ok for patient to get MMR vaccine today---dr jones would like patient to come back in 3 months for titer test---patient will be advised at nurse visit today, order has been entered

## 2017-01-26 ENCOUNTER — Encounter: Payer: Self-pay | Admitting: Internal Medicine

## 2017-01-26 LAB — MEASLES/MUMPS/RUBELLA IMMUNITY
Mumps IgG: 10.7 AU/mL — ABNORMAL HIGH (ref <9.00)
Rubella: 1.95 Index — ABNORMAL HIGH (ref <0.90)
Rubeola IgG: 49.9 AU/mL — ABNORMAL HIGH (ref <25.00)

## 2017-01-26 LAB — VARICELLA ZOSTER ANTIBODY, IGG: Varicella IgG: 331.3 Index — ABNORMAL HIGH (ref <135.00)

## 2017-02-23 ENCOUNTER — Telehealth: Payer: Self-pay | Admitting: Internal Medicine

## 2017-02-23 NOTE — Telephone Encounter (Signed)
Patient requesting call back in regard to immunization form.  He states he gave this form to the lady who did his last injection.  Harold Nichols, was this you?

## 2017-04-19 ENCOUNTER — Ambulatory Visit (INDEPENDENT_AMBULATORY_CARE_PROVIDER_SITE_OTHER): Payer: 59 | Admitting: Family Medicine

## 2017-04-19 ENCOUNTER — Encounter: Payer: Self-pay | Admitting: Family Medicine

## 2017-04-19 ENCOUNTER — Other Ambulatory Visit (INDEPENDENT_AMBULATORY_CARE_PROVIDER_SITE_OTHER): Payer: 59

## 2017-04-19 VITALS — BP 136/74 | HR 71 | Temp 98.8°F | Ht 69.0 in | Wt 143.0 lb

## 2017-04-19 DIAGNOSIS — R634 Abnormal weight loss: Secondary | ICD-10-CM

## 2017-04-19 DIAGNOSIS — R197 Diarrhea, unspecified: Secondary | ICD-10-CM | POA: Diagnosis not present

## 2017-04-19 LAB — COMPREHENSIVE METABOLIC PANEL
ALT: 10 U/L (ref 0–53)
AST: 14 U/L (ref 0–37)
Albumin: 4.9 g/dL (ref 3.5–5.2)
Alkaline Phosphatase: 64 U/L (ref 39–117)
BILIRUBIN TOTAL: 0.7 mg/dL (ref 0.2–1.2)
BUN: 13 mg/dL (ref 6–23)
CALCIUM: 9.9 mg/dL (ref 8.4–10.5)
CO2: 31 meq/L (ref 19–32)
Chloride: 102 mEq/L (ref 96–112)
Creatinine, Ser: 1.25 mg/dL (ref 0.40–1.50)
GFR: 75.91 mL/min (ref >60.00)
Glucose, Bld: 97 mg/dL (ref 70–99)
POTASSIUM: 3.9 meq/L (ref 3.5–5.1)
Sodium: 139 mEq/L (ref 135–145)
Total Protein: 7.3 g/dL (ref 6.0–8.3)

## 2017-04-19 LAB — CBC WITH DIFFERENTIAL/PLATELET
BASOS ABS: 0.1 10*3/uL (ref 0.0–0.1)
BASOS PCT: 0.7 % (ref 0.0–3.0)
EOS PCT: 2.2 % (ref 0.0–5.0)
Eosinophils Absolute: 0.2 10*3/uL (ref 0.0–0.7)
HEMATOCRIT: 44 % (ref 39.0–52.0)
Hemoglobin: 15.5 g/dL (ref 13.0–17.0)
LYMPHS PCT: 21.2 % (ref 12.0–46.0)
Lymphs Abs: 1.8 10*3/uL (ref 0.7–4.0)
MCHC: 35.2 g/dL (ref 30.0–36.0)
MCV: 88.6 fl (ref 78.0–100.0)
MONOS PCT: 8 % (ref 3.0–12.0)
Monocytes Absolute: 0.7 10*3/uL (ref 0.1–1.0)
NEUTROS ABS: 5.8 10*3/uL (ref 1.4–7.7)
Neutrophils Relative %: 67.9 % (ref 43.0–77.0)
PLATELETS: 186 10*3/uL (ref 150.0–400.0)
RBC: 4.97 Mil/uL (ref 4.22–5.81)
RDW: 12.5 % (ref 11.5–15.5)
WBC: 8.6 10*3/uL (ref 4.0–10.5)

## 2017-04-19 LAB — TSH: TSH: 2.92 u[IU]/mL (ref 0.35–4.50)

## 2017-04-19 NOTE — Assessment & Plan Note (Signed)
This has resolved. Workup provided for this weekend.

## 2017-04-19 NOTE — Assessment & Plan Note (Signed)
Unclear as the source of his lack of appetite in recent weight loss. Could be associated with the stress of school starting. He denies any depressive or anxiety symptoms. - Lab work today - If no source identified could make a referral to nutritionist

## 2017-04-19 NOTE — Progress Notes (Signed)
Harold Nichols - 21 y.o. male MRN [id number]  Date of birth: May 04, 1996  SUBJECTIVE:  Including CC & ROS.  Chief Complaint  Patient presents with  . Diarrhea    over the weekend, has decreased appetitie on going for 2 months-has noticed some weight loss    Harold Nichols is a 21 year old male is presenting with diarrhea and weight loss. He had diarrhea over the course of the weekend and has since resolved. It was watery in nature. He reports eating [place] food on Friday night and felt that this was associated with his illness. Denies any fevers or chills. Denies any rashes. Has not had to take any further medications. Symptoms are completely resolved.  Reports a 10 pound weight loss over the course of a couple of months. This was unintentional. He has not been working at more than usual. He does have a lack of appetite. 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 He denies any depressive or anxiety type symptoms. He has had some changes in his sleeping patterns since school started. Denies any pain. Denies any heat or cold intolerance. Denies any family history that could contribute.     Review of Systems  Constitutional: Negative for fever.  Gastrointestinal: Negative for abdominal pain, diarrhea and nausea.  Endocrine: Negative for cold intolerance and heat intolerance.  Genitourinary: Negative for frequency.  Musculoskeletal: Negative for arthralgias.  Skin: Negative for color change.  Neurological: Negative for weakness and numbness.  Hematological: Negative for adenopathy.  Psychiatric/Behavioral: Negative for agitation.    HISTORY: Past Medical, Surgical, Social, and Family History Reviewed & Updated per EMR.   Pertinent Historical Findings include:  Past Medical History:  Diagnosis Date  . Allergy     Past Surgical History:  Procedure Laterality Date  . APPENDECTOMY  2007  . HERNIA REPAIR      No Known Allergies  Family History  Problem Relation Age of  Onset  . Hyperlipidemia Maternal Grandmother   . Hyperlipidemia Maternal Grandfather   . Hyperlipidemia Paternal Grandmother   . Hyperlipidemia Paternal Grandfather   . Heart disease Father   . Cancer Neg Hx   . Early death Neg Hx   . Diabetes Neg Hx   . Hypertension Neg Hx   . Kidney disease Neg Hx   . Stroke Neg Hx      Social History   Social History  . Marital status: Single    Spouse name: N/A  . Number of children: N/A  . Years of education: N/A   Occupational History  . Not on file.   Social History Main Topics  . Smoking status: Never Smoker  . Smokeless tobacco: Never Used  . Alcohol use 1.8 oz/week    3 Cans of beer per week  . Drug use: No  . Sexual activity: Yes    Birth control/ protection: None   Other Topics Concern  . Not on file   Social History Narrative  . No narrative on file     PHYSICAL EXAM:  VS: BP 136/74 (BP Location: Left Arm, Patient Position: Sitting, Cuff Size: Normal)   Pulse 71   Temp 98.8 F (37.1 C) (Oral)   Ht  (1.753 m)   Wt 143 lb (64.9 kg)   SpO2 98%   BMI 21.12 kg/m  Physical Exam Gen: NAD, alert, cooperative with exam, well-appearing ENT: normal lips, normal nasal mucosa,  Eye: normal EOM, normal conjunctiva and lids CV:  no edema, +2 pedal pulses,  regular rate and rhythm, S1-S2   Resp: no accessory muscle use, non-labored, clear to auscultation bilaterally, no crackles or wheezes  GI: no masses or tenderness, no hernia, soft, positive bowel sounds  Skin: no rashes, no areas of induration  Neuro: normal tone, normal sensation to touch Psych:  normal insight, alert and oriented MSK: Normal gait, normal strength      ASSESSMENT & PLAN:   Weight loss Unclear as the source of his lack of appetite in recent weight loss. Could be associated with the stress of school starting. He denies any depressive or anxiety symptoms. - Lab work today - If no source identified could make a referral to  nutritionist  Diarrhea This has resolved. Workup provided for this weekend.

## 2017-04-19 NOTE — Patient Instructions (Signed)
Thank you for coming in,   We will call you with the results from today.    Please feel free to call with any questions or concerns at any time, at [phone]. --Dr. Schmitz  

## 2017-04-28 ENCOUNTER — Telehealth: Payer: Self-pay | Admitting: Internal Medicine

## 2017-04-28 ENCOUNTER — Ambulatory Visit (INDEPENDENT_AMBULATORY_CARE_PROVIDER_SITE_OTHER): Payer: 59 | Admitting: Internal Medicine

## 2017-04-28 ENCOUNTER — Encounter: Payer: Self-pay | Admitting: Internal Medicine

## 2017-04-28 VITALS — BP 122/62 | HR 75 | Temp 97.8°F | Resp 16 | Ht 69.0 in | Wt 148.0 lb

## 2017-04-28 DIAGNOSIS — Z23 Encounter for immunization: Secondary | ICD-10-CM

## 2017-04-28 DIAGNOSIS — F4322 Adjustment disorder with anxiety: Secondary | ICD-10-CM | POA: Diagnosis not present

## 2017-04-28 MED ORDER — MIRTAZAPINE 7.5 MG PO TABS: 7.5000 mg | ORAL_TABLET | Freq: Every day | ORAL | 1 refills | Status: DC

## 2017-04-28 MED FILL — MIRTAZAPINE 7.5 MG TABLET: 7.5 | 90 days supply | Qty: 90 | Fill #0

## 2017-04-28 NOTE — Patient Instructions (Signed)
Adjustment Disorder, Adult Adjustment disorder is a group of symptoms that can develop after a stressful life event, such as the loss of a job or serious physical illness. The symptoms can affect how you feel, think, and act. They may interfere with your relationships. Adjustment disorder increases your risk of suicide and substance abuse. If this disorder is not managed early, it can develop into a more serious condition, such as major depressive disorder or post-traumatic stress disorder. What are the causes? This condition happens when you have trouble recovering from or coping with a stressful life event. What increases the risk? You are more likely to develop this condition if:  You have had depression or anxiety.  You are being treated for a long-term (chronic) illness.  You are being treated for an illness that cannot be cured (terminal illness).  You have a family history of mental illness.  What are the signs or symptoms? Symptoms of this condition include:  Extreme trouble doing daily tasks, such as going to work.  Sadness, depression, or crying spells.  Worrying a lot.  Loss of enjoyment.  Change in appetite or weight.  Feelings of loss or hopelessness.  Thoughts of suicide.  Anxiety, worry, or nervousness.  Trouble sleeping.  Avoiding family and friends.  Fighting or vandalism.  Complaining of feeling sick without being ill.  Feeling dazed or disconnected.  Nightmares.  Trouble sleeping.  Irritability.  Reckless driving.  Poor work performance.  Ignoring bills.  Symptoms of this condition start within three months of the stressful event. They do not last more than six months, unless the stressful circumstances last longer. Normal grieving after the death of a loved one is not a symptom of this condition. How is this diagnosed? To diagnose this condition, your health care provider will ask about what has happened in your life and how it has  affected you. He or she may also ask about your medical history and your use of medicines, alcohol, and other substances. Your health care provider may do a physical exam and order lab tests or other studies. You may be referred to a mental health specialist. How is this treated? Treatment options for this condition include:  Counseling or talk therapy. Talk therapy is usually provided by mental health specialists.  Medicines. Certain medicines may help with depression, anxiety, and sleep.  Support groups. These offer emotional support, advice, and guidance. They are made up of people who have had similar experiences.  Observation and time. This is sometimes called "watchful waiting." In this treatment, health care providers monitor your health and behavior without other treatment. Adjustment disorder sometimes gets better on its own with time.  Follow these instructions at home:  Take over-the-counter and prescription medicines only as told by your health care provider.  Keep all follow-up visits as told by your health care provider. This is important. Contact a health care provider if:  Your symptoms do not improve in six months.  Your symptoms get worse. Get help right away if:  You have serious thoughts about hurting yourself or someone else. If you ever feel like you may hurt yourself or others, or have thoughts about taking your own life, get help right away. You can go to your nearest emergency department or call:  Your local emergency services (911 in the [place]  A suicide crisis helpline, such as the National Suicide Prevention Lifeline at 1-800-273-8255. This is open 24 hours a day.  Summary  Adjustment disorder is a group of   symptoms that can develop after a stressful life event, such as the loss of a job or serious physical illness. The symptoms can affect how you feel, think, and act. They may interfere with your relationships.  Symptoms of this condition start within  three months of the stressful event. They do not last more than six months, unless the stressful circumstances last longer.  Treatment may include talk therapy, medicines, participation in a support group, or observation to see if symptoms improve.  Contact your health care provider if your symptoms get worse or do not improve in six months.  If you ever feel like you may hurt yourself or others, or have thoughts about taking your own life, get help right away. This information is not intended to replace advice given to you by your health care provider. Make sure you discuss any questions you have with your health care provider. Document Released: 03/02/2006 Document Revised: 08/27/2016 Document Reviewed: 08/27/2016 Elsevier Interactive Patient Education  2018 Elsevier Inc.  

## 2017-04-28 NOTE — Progress Notes (Signed)
Subjective:  Patient ID: Harold Nichols, male    DOB: 1995/12/17  Age: 21 y.o. MRN: [id number]  CC: Depression   HPI Harold Nichols presents for f/up- he was recently seen by another provider for wt loss, his labs were normal. He tells me that he entered [employer] school a few months ago and that it has been "stressful" - he reports LOA, early satiety, and decreased appetite. He wants to regain his appetite and put some wt back on.  Outpatient Medications Prior to Visit  Medication Sig Dispense Refill  . loratadine (CLARITIN) 10 MG tablet Take 10 mg by mouth daily as needed for allergies. Reported on 08/14/2015     No facility-administered medications prior to visit.     ROS Review of Systems  Constitutional: Positive for appetite change and unexpected weight change. Negative for activity change, diaphoresis and fatigue.  HENT: Negative for trouble swallowing.   Respiratory: Negative.   Cardiovascular: Negative.   Gastrointestinal: Negative for abdominal pain, constipation, diarrhea and vomiting.  Endocrine: Negative.   Genitourinary: Negative.   Musculoskeletal: Negative.   Skin: Negative.   Psychiatric/Behavioral: Negative for agitation, behavioral problems, decreased concentration, dysphoric mood, self-injury, sleep disturbance and suicidal ideas. The patient is nervous/anxious. The patient is not hyperactive.     Objective:  BP 122/62 (BP Location: Left Arm, Patient Position: Sitting, Cuff Size: Normal)   Pulse 75   Temp 97.8 F (36.6 C) (Oral)   Ht 5\' 9"  (1.753 m)   Wt 148 lb (67.1 kg)   SpO2 98%   BMI 21.86 kg/m   BP Readings from Last 3 Encounters:  04/28/17 122/62  04/19/17 136/74  11/02/16 124/60    Wt Readings from Last 3 Encounters:  04/28/17 148 lb (67.1 kg)  04/19/17 143 lb (64.9 kg)  11/02/16 150 lb 8 oz (68.3 kg)    Physical Exam  Constitutional: No distress.  HENT:  Mouth/Throat: Oropharynx is clear and moist. No oropharyngeal exudate.  Eyes:  Conjunctivae are normal. Right eye exhibits no discharge. Left eye exhibits no discharge. No scleral icterus.  Neck: Normal range of motion. Neck supple. No JVD present.  Cardiovascular: Normal rate, regular rhythm and intact distal pulses.  Exam reveals no gallop and no friction rub.   No murmur heard. Pulmonary/Chest: Effort normal and breath sounds normal. No respiratory distress. He has no wheezes. He has no rales. He exhibits no tenderness.  Abdominal: Soft. Bowel sounds are normal. He exhibits no distension and no mass. There is no tenderness. There is no rebound and no guarding.  Lymphadenopathy:    He has no cervical adenopathy.  Skin: He is not diaphoretic.  Psychiatric: He has a normal mood and affect. His behavior is normal. Judgment and thought content normal.  Vitals reviewed.   Lab Results  Component Value Date   WBC 8.6 04/19/2017   HGB 15.5 04/19/2017   HCT 44.0 04/19/2017   PLT 186.0 04/19/2017   GLUCOSE 97 04/19/2017   CHOL 169 05/19/2015   TRIG 133.0 05/19/2015   HDL 60.40 05/19/2015   LDLCALC 82 05/19/2015   ALT 10 04/19/2017   AST 14 04/19/2017   NA 139 04/19/2017   K 3.9 04/19/2017   CL 102 04/19/2017   CREATININE 1.25 04/19/2017   BUN 13 04/19/2017   CO2 31 04/19/2017   TSH 2.92 04/19/2017    Ct Pelvis W Contrast  Result Date: 08/12/2008 Clinical Data:  Abdominal pain, pressure on urination, history of ruptured appendicitis as well as  hernia repair  CT ABDOMEN AND PELVIS WITH CONTRAST  Technique: Multidetector CT imaging of the abdomen and pelvis was performed using the standard protocol following bolus administration of intravenous contrast.  Contrast: 100 ml Optiray 358  Comparison: Chest and abdomen films of 05/16/2006  CT ABDOMEN  Findings:  The lung bases are clear.  The liver enhances with no focal abnormality and no ductal dilatation is seen.  No calcified gallstones are noted.  The stomach is somewhat distended by fluid, a finding of questionable  significance.  The pancreas is normal in size and the pancreatic duct is not dilated.  The adrenal glands appear normal and the spleen is minimally prominent.  The kidneys enhance with no evidence of calculi or hydronephrosis.  The abdominal aorta is normal in caliber.  There is a moderate amount of feces throughout the entire colon.  IMPRESSION:  1.  No acute abnormality on CT of the abdomen. 2.  Fluid-distended stomach of questionable significance. 3.  The spleen is within upper limits of normal. 4.  Moderate amount of feces throughout the entire colon.  CT PELVIS  Findings:  Again there is a moderate amount of feces throughout the colon.  The urinary bladder is somewhat urine distended.  No intraluminal bladder lesion is seen.  Surgical clips lie just posterior to the urinary bladder between the rectum and the bladder.  No pelvic mass, fluid, or adenopathy is seen.  The terminal ileum appears normal.  Surgical clips are noted near the base of the cecum secondary to prior appendectomy.  IMPRESSION:  1.  Moderate amount of feces throughout the colon. 2.  The urinary bladder is somewhat distended. 3.  No acute abnormality.  Dr. Caleen Jobs was in surgery at the time of interpretation, and this study will be discussed after his completion of that surgery. Provider: Lanny Hurst, Gaynelle Cage  Ct Abdomen W Contrast  Result Date: 08/12/2008 Clinical Data:  Abdominal pain, pressure on urination, history of ruptured appendicitis as well as hernia repair  CT ABDOMEN AND PELVIS WITH CONTRAST  Technique: Multidetector CT imaging of the abdomen and pelvis was performed using the standard protocol following bolus administration of intravenous contrast.  Contrast: 100 ml Optiray 358  Comparison: Chest and abdomen films of 05/16/2006  CT ABDOMEN  Findings:  The lung bases are clear.  The liver enhances with no focal abnormality and no ductal dilatation is seen.  No calcified gallstones are noted.  The stomach is somewhat  distended by fluid, a finding of questionable significance.  The pancreas is normal in size and the pancreatic duct is not dilated.  The adrenal glands appear normal and the spleen is minimally prominent.  The kidneys enhance with no evidence of calculi or hydronephrosis.  The abdominal aorta is normal in caliber.  There is a moderate amount of feces throughout the entire colon.  IMPRESSION:  1.  No acute abnormality on CT of the abdomen. 2.  Fluid-distended stomach of questionable significance. 3.  The spleen is within upper limits of normal. 4.  Moderate amount of feces throughout the entire colon.  CT PELVIS  Findings:  Again there is a moderate amount of feces throughout the colon.  The urinary bladder is somewhat urine distended.  No intraluminal bladder lesion is seen.  Surgical clips lie just posterior to the urinary bladder between the rectum and the bladder.  No pelvic mass, fluid, or adenopathy is seen.  The terminal ileum appears normal.  Surgical clips are noted near the base  of the cecum secondary to prior appendectomy.  IMPRESSION:  1.  Moderate amount of feces throughout the colon. 2.  The urinary bladder is somewhat distended. 3.  No acute abnormality.  Dr. Caleen JobsFarouqi was in surgery at the time of interpretation, and this study will be discussed after his completion of that surgery. Provider: Lanny HurstMegan Smith, Gaynelle CageElizabeth Soots   Assessment & Plan:   Chrissie NoaWilliam was seen today for depression.  Diagnoses and all orders for this visit:  Need for influenza vaccination -     Flu Vaccine QUAD 36+ mos IM  Adjustment disorder with anxious mood- he does not meet the criteria for depression but does for adjustment d/o, will start remeron to help with appetite and wt loss -     Discontinue: mirtazapine (REMERON) 7.5 MG tablet; Take 1 tablet (7.5 mg total) by mouth at bedtime.   I am having Mr. Mcdonald maintain his loratadine.  Meds ordered this encounter  Medications  . DISCONTD: mirtazapine (REMERON) 7.5  MG tablet    Sig: Take 1 tablet (7.5 mg total) by mouth at bedtime.    Dispense:  90 tablet    Refill:  1     Follow-up: Return in about 3 months (around 07/29/2017).  Sanda Lingerhomas Kyndal Heringer, MD

## 2017-04-28 NOTE — Telephone Encounter (Signed)
rx resent  

## 2017-04-28 NOTE — Telephone Encounter (Signed)
Patient requesting remeron to be sent to [facility].

## 2017-08-17 ENCOUNTER — Ambulatory Visit (INDEPENDENT_AMBULATORY_CARE_PROVIDER_SITE_OTHER)
Admission: RE | Admit: 2017-08-17 | Discharge: 2017-08-17 | Disposition: A | Discharge status: Home / Self Care | Payer: No Typology Code available for payment source | Source: Ambulatory Visit | Attending: Family | Admitting: Family

## 2017-08-17 ENCOUNTER — Telehealth: Payer: Self-pay | Admitting: Family

## 2017-08-17 ENCOUNTER — Other Ambulatory Visit: Payer: Self-pay | Admitting: Family

## 2017-08-17 ENCOUNTER — Ambulatory Visit: Payer: Self-pay | Admitting: Family

## 2017-08-17 DIAGNOSIS — J3489 Other specified disorders of nose and nasal sinuses: Secondary | ICD-10-CM

## 2017-08-17 NOTE — Telephone Encounter (Signed)
Please call this patient to let him know that his X-ray was normal and did not show any fracture; please follow-up if he has further questions or concerns as I see appointment that was originally scheduled today has been cancelled.

## 2017-08-17 NOTE — Telephone Encounter (Signed)
Tried to reach patient today to give him results but his voicemail hadn't been set up yet so I could not leave message. I created CRM incase he calls back so info can be provided to him.

## 2017-08-17 NOTE — Telephone Encounter (Signed)
Called again and not able to reach patient. CRM was already created incase he calls back.

## 2017-08-17 NOTE — Progress Notes (Signed)
Patient aware that X-ray did not show any fracture; he opted to cancel appointment as a result.

## 2017-12-19 ENCOUNTER — Other Ambulatory Visit: Payer: No Typology Code available for payment source

## 2017-12-19 ENCOUNTER — Telehealth: Payer: Self-pay | Admitting: Internal Medicine

## 2017-12-19 ENCOUNTER — Ambulatory Visit (INDEPENDENT_AMBULATORY_CARE_PROVIDER_SITE_OTHER): Payer: No Typology Code available for payment source | Admitting: *Deleted

## 2017-12-19 DIAGNOSIS — Z23 Encounter for immunization: Secondary | ICD-10-CM | POA: Diagnosis not present

## 2017-12-19 DIAGNOSIS — Z111 Encounter for screening for respiratory tuberculosis: Secondary | ICD-10-CM

## 2017-12-19 NOTE — Telephone Encounter (Signed)
Pt scheduled for today at 3 pm.

## 2017-12-19 NOTE — Telephone Encounter (Signed)
Can you schedule him for a NV today please?

## 2017-12-19 NOTE — Telephone Encounter (Signed)
Patient states he believes he needs an MMR for school vacs.  Patient is requesting to be worked in with Dr. Ronnald Ramp.  Please advise.

## 2017-12-19 NOTE — Telephone Encounter (Signed)
He can come in for a NV and get these

## 2017-12-19 NOTE — Telephone Encounter (Signed)
Copied from CRM [phone]. Topic: Appointment Scheduling - Scheduling Inquiry for Clinic >> Dec 19, 2017  7:59 AM Jay SchlichterWeikart, Melissa J wrote: Reason for CRM: pt needs to have vaccines for school  Measles Chicken pox He has a list but not with him.   Cb is [phone]  >> Dec 19, 2017  8:46 AM Arlyss Gandyichardson, Michal Strzelecki N, NT wrote: Pt calling back and states he does not need the previous immunzations listed but does need to come in today for a quantiferion gold TB test. Pt states it is due today for school. Please call if dr can get the order in for today for pt to come in.

## 2017-12-19 NOTE — Telephone Encounter (Signed)
Do you want pt to come in for an appt?  Pt needs vaccine and labs for school.   Please advise.

## 2017-12-21 ENCOUNTER — Encounter: Payer: Self-pay | Admitting: Internal Medicine

## 2017-12-21 LAB — QUANTIFERON-TB GOLD PLUS
NIL: 0.04 [IU]/mL
QUANTIFERON-TB GOLD PLUS: NEGATIVE
TB1-NIL: 0.02 [IU]/mL
TB2-NIL: 0 [IU]/mL

## 2018-05-03 ENCOUNTER — Ambulatory Visit: Payer: No Typology Code available for payment source

## 2018-07-28 ENCOUNTER — Other Ambulatory Visit (INDEPENDENT_AMBULATORY_CARE_PROVIDER_SITE_OTHER): Payer: No Typology Code available for payment source

## 2018-07-28 ENCOUNTER — Ambulatory Visit (INDEPENDENT_AMBULATORY_CARE_PROVIDER_SITE_OTHER): Payer: No Typology Code available for payment source | Admitting: Family

## 2018-07-28 ENCOUNTER — Encounter: Payer: Self-pay | Admitting: Family

## 2018-07-28 VITALS — BP 120/70 | HR 84 | Temp 98.5°F | Ht 69.0 in | Wt 149.0 lb

## 2018-07-28 DIAGNOSIS — J029 Acute pharyngitis, unspecified: Secondary | ICD-10-CM

## 2018-07-28 LAB — STREP COMPLETE PANEL
Strep Pyogenes: NEGATIVE
Strep dysgalactiae: NEGATIVE

## 2018-07-28 LAB — MONONUCLEOSIS SCREEN: Mono Screen: NEGATIVE

## 2018-07-28 NOTE — Progress Notes (Signed)
  Harold Nichols is a 23 y.o. male with the following history as recorded in EpicCare:  Patient Active Problem List   Diagnosis Date Noted  . Adjustment disorder with anxious mood 04/28/2017  . Routine general medical examination at a health care facility 05/19/2015    Current Outpatient Medications  Medication Sig Dispense Refill  . loratadine (CLARITIN) 10 MG tablet Take 10 mg by mouth daily as needed for allergies. Reported on 08/14/2015    . mirtazapine (REMERON) 7.5 MG tablet Take 1 tablet (7.5 mg total) by mouth at bedtime. 90 tablet 1   No current facility-administered medications for this visit.     Allergies: Patient has no known allergies.  Past Medical History:  Diagnosis Date  . Allergy     Past Surgical History:  Procedure Laterality Date  . APPENDECTOMY  2007  . HERNIA REPAIR      Family History  Problem Relation Age of Onset  . Hyperlipidemia Maternal Grandmother   . Hyperlipidemia Maternal Grandfather   . Hyperlipidemia Paternal Grandmother   . Hyperlipidemia Paternal Grandfather   . Heart disease Father   . Cancer Neg Hx   . Early death Neg Hx   . Diabetes Neg Hx   . Hypertension Neg Hx   . Kidney disease Neg Hx   . Stroke Neg Hx     Social History   Tobacco Use  . Smoking status: Never Smoker  . Smokeless tobacco: Never Used  Substance Use Topics  . Alcohol use: Yes    Alcohol/week: 3.0 standard drinks    Types: 3 Cans of beer per week    Subjective:  3 day history of sore throat/ notes that is actually feeling better today; no known exposure to flu or mono or strep; has had some night sweat; taking Ibuprofen with some relief.  Is in nursing school- currently on Neuro floor;    Objective:  Vitals:   07/28/18 1403  BP: 120/70  Pulse: 84  Temp: 98.5 F (36.9 C)  TempSrc: Oral  SpO2: 98%  Weight: 149 lb 0.6 oz (67.6 kg)  Height: 5\' 9"  (1.753 m)    General: Well developed, well nourished, in no acute distress  Skin : Warm and dry.  Head:  Normocephalic and atraumatic  Eyes: Sclera and conjunctiva clear; pupils round and reactive to light; extraocular movements intact  Ears: External normal; canals clear; tympanic membranes normal  Oropharynx: Pink, supple. No suspicious lesions  Neck: Supple without thyromegaly, adenopathy  Lungs: Respirations unlabored; clear to auscultation bilaterally without wheeze, rales, rhonchi  CVS exam: normal rate, regular rhythm Neurologic: Alert and oriented; speech intact; face symmetrical; moves all extremities well; CNII-XII intact without focal deficit   Assessment:  1. Sore throat     Plan:  Will update Monospot rapid strep today; follow-up to be determined;  No follow-ups on file.  Orders Placed This Encounter  Procedures  . Strep Complete Panel    Standing Status:   Future    Number of Occurrences:   1    Standing Expiration Date:   07/29/2019  . Monospot    Standing Status:   Future    Number of Occurrences:   1    Standing Expiration Date:   07/28/2019    Requested Prescriptions    No prescriptions requested or ordered in this encounter

## 2019-02-08 ENCOUNTER — Ambulatory Visit (INDEPENDENT_AMBULATORY_CARE_PROVIDER_SITE_OTHER): Payer: No Typology Code available for payment source | Admitting: Internal Medicine

## 2019-02-08 ENCOUNTER — Encounter: Payer: Self-pay | Admitting: Internal Medicine

## 2019-02-08 DIAGNOSIS — R509 Fever, unspecified: Secondary | ICD-10-CM

## 2019-02-08 MED ORDER — AZITHROMYCIN 250 MG PO TABS: ORAL_TABLET | ORAL | 1 refills | Status: DC

## 2019-02-08 NOTE — Patient Instructions (Signed)
Please take all new medication as prescribed  - the antibiotic  Please continue all other medications as before, and refills have been done if requested.  Please have the pharmacy call with any other refills you may need.  Please keep your appointments with your specialists as you may have planned  Please go to the [facility]

## 2019-02-08 NOTE — Progress Notes (Signed)
Patient ID: Harold Nichols, male   DOB: 1995/08/10, 23 y.o.   MRN: [id number]  Virtual Visit via Video Note  I connected with Colan Neptune on 02/08/19 at  7:00 PM EDT by a video enabled telemedicine application and verified that I am speaking with the correct person using two identifiers.  Location: Patient: at home Provider: at home   I discussed the limitations of evaluation and management by telemedicine and the availability of in person appointments. The patient expressed understanding and agreed to proceed.  History of Present Illness: Here to f/u with c/o 3 days fever to 100.6, fatige, severe ST somewhat better with  Tylenol, but no ear pain, sinus pain, cough, sob, wheezing, CP.  Lives with girlfriend who has similar symptoms but neg covid testing Tuesday July 28 (and received neg result today).  Pt denies chest pain, increased sob or doe, wheezing, orthopnea, PND, increased LE swelling, palpitations, dizziness or syncope. Past Medical History:  Diagnosis Date  . Allergy    Past Surgical History:  Procedure Laterality Date  . APPENDECTOMY  2007  . HERNIA REPAIR      reports that he has never smoked. He has never used smokeless tobacco. He reports current alcohol use of about 3.0 standard drinks of alcohol per week. He reports that he does not use drugs. family history includes Heart disease in his father; Hyperlipidemia in his maternal grandfather, maternal grandmother, paternal grandfather, and paternal grandmother. No Known Allergies Current Outpatient Medications on File Prior to Visit  Medication Sig Dispense Refill  . loratadine (CLARITIN) 10 MG tablet Take 10 mg by mouth daily as needed for allergies. Reported on 08/14/2015    . mirtazapine (REMERON) 7.5 MG tablet Take 1 tablet (7.5 mg total) by mouth at bedtime. 90 tablet 1   No current facility-administered medications on file prior to visit.    Observations/Objective: Alert, NAD, mild ill. appropriate mood and  affect, resps normal, cn 2-12 intact, moves all 4s, no visible rash or swelling Lab Results  Component Value Date   WBC 8.6 04/19/2017   HGB 15.5 04/19/2017   HCT 44.0 04/19/2017   PLT 186.0 04/19/2017   GLUCOSE 97 04/19/2017   CHOL 169 05/19/2015   TRIG 133.0 05/19/2015   HDL 60.40 05/19/2015   LDLCALC 82 05/19/2015   ALT 10 04/19/2017   AST 14 04/19/2017   NA 139 04/19/2017   K 3.9 04/19/2017   CL 102 04/19/2017   CREATININE 1.25 04/19/2017   BUN 13 04/19/2017   CO2 31 04/19/2017   TSH 2.92 04/19/2017   Assessment and Plan: See notes  Follow Up Instructions: seenotes   I discussed the assessment and treatment plan with the patient. The patient was provided an opportunity to ask questions and all were answered. The patient agreed with the plan and demonstrated an understanding of the instructions.   The patient was advised to call back or seek an in-person evaluation if the symptoms worsen or if the condition fails to improve as anticipated.   Cathlean Cower, MD

## 2019-02-08 NOTE — Assessment & Plan Note (Signed)
Differential includes strep pharyngitis vs viral such as covid; for zpack asd, and refer COVID testing tomorrow,  to f/u any worsening symptoms or concerns

## 2019-02-09 ENCOUNTER — Other Ambulatory Visit: Payer: Self-pay

## 2019-02-09 DIAGNOSIS — Z20822 Contact with and (suspected) exposure to covid-19: Secondary | ICD-10-CM

## 2019-02-11 LAB — NOVEL CORONAVIRUS, NAA: SARS-CoV-2, NAA: NOT DETECTED

## 2019-02-12 ENCOUNTER — Telehealth: Payer: Self-pay

## 2019-02-12 NOTE — Telephone Encounter (Signed)
-----   Message from James W John, MD sent at 02/11/2019  6:42 PM EDT ----- Left message on MyChart, pt to cont same tx   Kylyn Sookram to please inform pt, COVID is neg 

## 2019-02-12 NOTE — Telephone Encounter (Signed)
Pt has viewed results via MyChart  

## 2019-06-16 ENCOUNTER — Encounter: Payer: Self-pay | Admitting: Internal Medicine

## 2019-06-18 ENCOUNTER — Telehealth: Payer: No Typology Code available for payment source

## 2019-06-18 ENCOUNTER — Ambulatory Visit (INDEPENDENT_AMBULATORY_CARE_PROVIDER_SITE_OTHER)
Admission: RE | Admit: 2019-06-18 | Discharge: 2019-06-18 | Disposition: A | Discharge status: Home / Self Care | Payer: No Typology Code available for payment source | Source: Ambulatory Visit

## 2019-06-18 ENCOUNTER — Encounter: Payer: Self-pay | Admitting: Internal Medicine

## 2019-06-18 ENCOUNTER — Encounter: Payer: No Typology Code available for payment source | Admitting: Internal Medicine

## 2019-06-18 DIAGNOSIS — J029 Acute pharyngitis, unspecified: Secondary | ICD-10-CM | POA: Diagnosis not present

## 2019-06-18 MED ORDER — AMOXICILLIN 500 MG PO CAPS: 500.0000 mg | ORAL_CAPSULE | Freq: Two times a day (BID) | ORAL | 0 refills | Status: AC

## 2019-06-18 NOTE — Progress Notes (Signed)
Virtual Visit via Video Note  I connected with Harold Nichols on 06/18/19 at  3:30 PM EST by a video enabled telemedicine application and verified that I am speaking with the correct person using two identifiers.   I discussed the limitations of evaluation and management by telemedicine and the availability of in person appointments. The patient expressed understanding and agreed to proceed.  Present for the visit:  Myself, Dr Billey Gosling, Lucas Mallow.  The patient is currently at home and I am in the office.    No referring provider.    History of Present Illness:    Social History   Socioeconomic History  . Marital status: Single    Spouse name: Not on file  . Number of children: Not on file  . Years of education: Not on file  . Highest education level: Not on file  Occupational History  . Not on file  Social Needs  . Financial resource strain: Not on file  . Food insecurity    Worry: Not on file    Inability: Not on file  . Transportation needs    Medical: Not on file    Non-medical: Not on file  Tobacco Use  . Smoking status: Never Smoker  . Smokeless tobacco: Never Used  Substance and Sexual Activity  . Alcohol use: Yes    Alcohol/week: 3.0 standard drinks    Types: 3 Cans of beer per week  . Drug use: No  . Sexual activity: Yes    Birth control/protection: None  Lifestyle  . Physical activity    Days per week: Not on file    Minutes per session: Not on file  . Stress: Not on file  Relationships  . Social Herbalist on phone: Not on file    Gets together: Not on file    Attends religious service: Not on file    Active member of club or organization: Not on file    Attends meetings of clubs or organizations: Not on file    Relationship status: Not on file  Other Topics Concern  . Not on file  Social History Narrative  . Not on file     Observations/Objective: Appears well in NAD   Assessment and Plan:  See Problem List for  Assessment and Plan of chronic medical problems.   Follow Up Instructions:    I discussed the assessment and treatment plan with the patient. The patient was provided an opportunity to ask questions and all were answered. The patient agreed with the plan and demonstrated an understanding of the instructions.   The patient was advised to call back or seek an in-person evaluation if the symptoms worsen or if the condition fails to improve as anticipated.    Binnie Rail, MD  This encounter was created in error - please disregard.

## 2019-06-18 NOTE — ED Provider Notes (Signed)
Virtual Visit via Video Note:  Harold Nichols  initiated request for Telemedicine visit with [facility] team. I connected with Harold Nichols  on 06/18/2019 at 8:58 AM  for a synchronized telemedicine visit using a video enabled HIPPA compliant telemedicine application. I verified that I am speaking with Harold Nichols  using two identifiers. Harold Gottron, NP  was physically located in a [facility]gent [facility] site and Harold Nichols was located at a different location.   The limitations of evaluation and management by telemedicine as well as the availability of in-person appointments were discussed. Patient was informed that he  may incur a bill ( including co-pay) for this virtual visit encounter. Harold Nichols  expressed understanding and gave verbal consent to proceed with virtual visit.     History of Present Illness:Harold Nichols  is a 23 y.o. male presents with complaints of sore throat. Feels like lymph nodes are swollen. Feels like he has had a low grade temp but has not checked it with a thermometer. Blister on lower lip. Symptoms started about 5 days ago. Sweating at night. No cough or runny nose. No ear pain. No gi symptoms. No known ill contacts. States his girlfriend has been well, who lives with him. Works as a Diplomatic Services operational officer. Has been taking tylenol, which does help. Has had similar in the past, approximately in July, which felt worse actually. He thinks it was strep in July. Gums have swelling. No skin rash. Pain with swallowing. No difficulty with swallowing. Decreased appetite. Without contributing medical history.    Past Medical History:  Diagnosis Date  . Allergy     No Known Allergies      Observations/Objective: Alert, oriented, non toxic in appearance. Clear coherent speech without difficulty. No increased work of breathing visualized.  No issues swallowing, managing secretions without difficulty; unable to visualize within throat. Redness to  inside of left lower lip noted  Assessment and Plan: Pharyngitis with lip sore. 5 days of illness. No other systemic symptoms. Unable to visualize tonsils with video, unfortunately. Suspect still likely viral pharyngitis, recommended waiting about 7 days of illness to see if getting any improvement, if not may complete course of amoxicillin for tonsillitis. In person precautions and recommendations discussed. Patient verbalized understanding and agreeable to plan.    Follow Up Instructions:    I discussed the assessment and treatment plan with the patient. The patient was provided an opportunity to ask questions and all were answered. The patient agreed with the plan and demonstrated an understanding of the instructions.   The patient was advised to call back or seek an in-person evaluation if the symptoms worsen or if the condition fails to improve as anticipated.  I provided 15 minutes of non-face-to-face time during this encounter.    Harold Gottron, NP  06/18/2019 8:58 AM         Harold Gottron, NP 06/18/19 [phone]

## 2019-06-18 NOTE — Discharge Instructions (Signed)
Push fluids to ensure adequate hydration and keep secretions thin.  Tylenol and/or ibuprofen as needed for pain or fevers.  Throat lozenges, gargles, chloraseptic spray, warm teas, popsicles etc to help with throat pain.   Over the counter medications as needed for symptoms.  If no improvement at all, may start antibiotics on 12/9. If you have any improvement in pain or temperatures there is no need to take this, as it is likely viral in nature.  If symptoms worsen or do not improve in the next week to return to be seen in person or to follow up with your PCP.

## 2019-08-13 ENCOUNTER — Other Ambulatory Visit: Payer: Self-pay

## 2019-08-13 ENCOUNTER — Encounter: Payer: Self-pay | Admitting: Internal Medicine

## 2019-08-13 ENCOUNTER — Inpatient Hospital Stay
Admission: RE | Admit: 2019-08-13 | Discharge: 2019-08-13 | Disposition: A | Discharge status: Home / Self Care | Payer: No Typology Code available for payment source | Source: Ambulatory Visit

## 2019-08-13 ENCOUNTER — Ambulatory Visit (INDEPENDENT_AMBULATORY_CARE_PROVIDER_SITE_OTHER): Payer: No Typology Code available for payment source | Admitting: Internal Medicine

## 2019-08-13 ENCOUNTER — Telehealth: Payer: Self-pay | Admitting: Internal Medicine

## 2019-08-13 VITALS — BP 124/64 | HR 81 | Temp 97.7°F | Resp 16 | Ht 69.0 in | Wt 136.0 lb

## 2019-08-13 DIAGNOSIS — Z23 Encounter for immunization: Secondary | ICD-10-CM

## 2019-08-13 DIAGNOSIS — R634 Abnormal weight loss: Secondary | ICD-10-CM

## 2019-08-13 DIAGNOSIS — G3281 Cerebellar ataxia in diseases classified elsewhere: Secondary | ICD-10-CM | POA: Diagnosis not present

## 2019-08-13 DIAGNOSIS — R270 Ataxia, unspecified: Secondary | ICD-10-CM | POA: Diagnosis not present

## 2019-08-13 LAB — BASIC METABOLIC PANEL
BUN: 14 mg/dL (ref 6–23)
CO2: 32 mEq/L (ref 19–32)
Calcium: 9.7 mg/dL (ref 8.4–10.5)
Chloride: 103 mEq/L (ref 96–112)
Creatinine, Ser: 1.07 mg/dL (ref 0.40–1.50)
GFR: 83.83 mL/min (ref >60.00)
Glucose, Bld: 95 mg/dL (ref 70–99)
Potassium: 4.4 mEq/L (ref 3.5–5.1)
Sodium: 141 mEq/L (ref 135–145)

## 2019-08-13 LAB — CBC WITH DIFFERENTIAL/PLATELET
Basophils Absolute: 0 10*3/uL (ref 0.0–0.1)
Basophils Relative: 0.4 % (ref 0.0–3.0)
Eosinophils Absolute: 0.4 10*3/uL (ref 0.0–0.7)
Eosinophils Relative: 5 % (ref 0.0–5.0)
HCT: 44.5 % (ref 39.0–52.0)
Hemoglobin: 15.8 g/dL (ref 13.0–17.0)
Lymphocytes Relative: 19.6 % (ref 12.0–46.0)
Lymphs Abs: 1.7 10*3/uL (ref 0.7–4.0)
MCHC: 35.4 g/dL (ref 30.0–36.0)
MCV: 88.4 fl (ref 78.0–100.0)
Monocytes Absolute: 0.6 10*3/uL (ref 0.1–1.0)
Monocytes Relative: 6.6 % (ref 3.0–12.0)
Neutro Abs: 6 10*3/uL (ref 1.4–7.7)
Neutrophils Relative %: 68.4 % (ref 43.0–77.0)
Platelets: 169 10*3/uL (ref 150.0–400.0)
RBC: 5.03 Mil/uL (ref 4.22–5.81)
RDW: 12.6 % (ref 11.5–15.5)
WBC: 8.7 10*3/uL (ref 4.0–10.5)

## 2019-08-13 LAB — HEPATIC FUNCTION PANEL
ALT: 11 U/L (ref 0–53)
AST: 16 U/L (ref 0–37)
Albumin: 4.6 g/dL (ref 3.5–5.2)
Alkaline Phosphatase: 53 U/L (ref 39–117)
Bilirubin, Direct: 0.1 mg/dL (ref 0.0–0.3)
Total Bilirubin: 0.8 mg/dL (ref 0.2–1.2)
Total Protein: 7 g/dL (ref 6.0–8.3)

## 2019-08-13 LAB — SEDIMENTATION RATE: Sed Rate: 1 mm/hr (ref 0–15)

## 2019-08-13 NOTE — Patient Instructions (Signed)

## 2019-08-13 NOTE — Telephone Encounter (Signed)
Patient states Dr. Yetta Barre placed a stat referral for imaging and that GI can not get him in until next week.  Patient would like to know if he could get another appt this week at another facility.

## 2019-08-13 NOTE — Progress Notes (Addendum)
Subjective:  Patient ID: Harold Nichols, male    DOB: 1996-06-24  Age: 24 y.o. MRN: [id number]  CC: Dizziness   This visit occurred during the SARS-CoV-2 public health emergency.  Safety protocols were in place, including screening questions prior to the visit, additional usage of staff PPE, and extensive cleaning of exam room while observing appropriate contact time as indicated for disinfecting solutions.    HPI [name] presents for f/up - He complains that 2 days ago he was at the beach.  He was drinking coffee and did about 50 push-ups when he got up he felt dizzy, lightheaded, and vertiginous.  He says he went to bed and slept and felt better but the symptoms have persisted intermittently over the last 2 days.  He complains of a muffled sensation in his right ear, blurred vision, vertigo, numbness in his tongue, and ataxia.  He denies headache, nausea, vomiting, rash, fever, chills, abdominal pain, or diarrhea.  He says he drinks a few beers each day and occasionally smokes cigarettes but denies use of illicit drugs.  No outpatient medications prior to visit.   No facility-administered medications prior to visit.    ROS Review of Systems  Constitutional: Positive for unexpected weight change (wt loss). Negative for appetite change, chills, fatigue and fever.  HENT: Positive for ear pain. Negative for facial swelling, sinus pressure, sore throat, trouble swallowing and voice change.   Eyes: Positive for visual disturbance. Negative for photophobia and pain.  Respiratory: Negative for cough, chest tightness and wheezing.   Cardiovascular: Negative for chest pain, palpitations and leg swelling.  Gastrointestinal: Negative for abdominal pain, diarrhea, nausea and vomiting.  Endocrine: Negative.   Genitourinary: Negative for difficulty urinating.  Musculoskeletal: Negative.  Negative for back pain, myalgias, neck pain and neck stiffness.  Skin: Negative.  Negative for rash.    Neurological: Positive for dizziness and numbness. Negative for tremors, seizures, syncope, facial asymmetry, speech difficulty, weakness, light-headedness and headaches.  Hematological: Negative for adenopathy. Does not bruise/bleed easily.  Psychiatric/Behavioral: Negative.     Objective:  BP 124/64   Pulse 81   Temp 97.7 F (36.5 C) (Oral)   Resp 16   Ht 5' 9"  (1.753 m)   Wt 136 lb (61.7 kg)   SpO2 99%   BMI 20.08 kg/m   BP Readings from Last 3 Encounters:  08/13/19 124/64  07/28/18 120/70  04/28/17 122/62    Wt Readings from Last 3 Encounters:  08/13/19 136 lb (61.7 kg)  07/28/18 149 lb 0.6 oz (67.6 kg)  04/28/17 148 lb (67.1 kg)    Physical Exam Vitals reviewed.  Constitutional:      Appearance: Normal appearance.  HENT:     Nose: Nose normal.  Eyes:     General: No scleral icterus.    Conjunctiva/sclera: Conjunctivae normal.  Cardiovascular:     Rate and Rhythm: Normal rate and regular rhythm.     Pulses: Normal pulses.     Heart sounds: No murmur.  Pulmonary:     Effort: Pulmonary effort is normal.     Breath sounds: No stridor. No wheezing, rhonchi or rales.  Abdominal:     General: Abdomen is flat. Bowel sounds are normal. There is no distension.     Palpations: Abdomen is soft. There is no hepatomegaly, splenomegaly or mass.     Tenderness: There is no abdominal tenderness.  Musculoskeletal:        General: Normal range of motion.  Cervical back: Neck supple.     Right lower leg: No edema.     Left lower leg: No edema.  Lymphadenopathy:     Cervical: No cervical adenopathy.  Skin:    General: Skin is warm and dry.     Coloration: Skin is not pale.     Findings: No rash.  Neurological:     General: No focal deficit present.     Mental Status: He is alert and oriented to person, place, and time. Mental status is at baseline.     Cranial Nerves: Cranial nerves are intact. No cranial nerve deficit or dysarthria.     Sensory: Sensation is  intact.     Motor: Motor function is intact. No weakness, tremor, atrophy or abnormal muscle tone.     Coordination: Coordination is intact. Coordination normal. Heel to [facility] Test normal.     Deep Tendon Reflexes: Reflexes normal. Babinski sign absent on the right side. Babinski sign present on the left side.     Reflex Scores:      Tricep reflexes are 1+ on the right side and 1+ on the left side.      Bicep reflexes are 1+ on the right side and 1+ on the left side.      Brachioradialis reflexes are 1+ on the right side and 1+ on the left side.      Patellar reflexes are 1+ on the right side and 1+ on the left side.      Achilles reflexes are 1+ on the right side and 1+ on the left side. Psychiatric:        Attention and Perception: Attention and perception normal.        Mood and Affect: Mood is anxious. Mood is not elated. Affect is not labile or flat.        Speech: Speech normal.        Behavior: Behavior normal.        Thought Content: Thought content normal. Thought content is not paranoid or delusional. Thought content does not include homicidal ideation.        Cognition and Memory: Cognition normal.        Judgment: Judgment normal.     Lab Results  Component Value Date   WBC 8.6 04/19/2017   HGB 15.5 04/19/2017   HCT 44.0 04/19/2017   PLT 186.0 04/19/2017   GLUCOSE 97 04/19/2017   CHOL 169 05/19/2015   TRIG 133.0 05/19/2015   HDL 60.40 05/19/2015   LDLCALC 82 05/19/2015   ALT 10 04/19/2017   AST 14 04/19/2017   NA 139 04/19/2017   K 3.9 04/19/2017   CL 102 04/19/2017   CREATININE 1.25 04/19/2017   BUN 13 04/19/2017   CO2 31 04/19/2017   TSH 2.92 04/19/2017    No results found.  Assessment & Plan:   Saw was seen today for dizziness.  Diagnoses and all orders for this visit:  Ataxia- See below. -     CBC with Differential/Platelet -     Sedimentation rate -     Drug Abuse 10-50+Ethanol, U -     MR Brain Wo Contrast; Future  Weight loss, abnormal- I  have ordered a labs to screen for metabolic and organic illness.  Will also screen for substance abuse. -     CBC with Differential/Platelet -     Sedimentation rate -     Hepatic function panel -     Thyroid Panel With TSH -  Drug Abuse 10-50+Ethanol, U -     Basic metabolic panel  Cerebellar ataxia in diseases classified elsewhere [facility])- He has a 2-day hx/myriad of neurological symptoms and an upgoing toe on the left side.  I recommended that he undergo MRI of the brain to see if there is a CNS process such as vasculitis, mass, NPH, bleeding, or CVA. -     MR Brain Wo Contrast; Future  Other orders -     Flu Vaccine QUAD 6+ mos PF IM (Fluarix Quad PF)   Orson Ape. [facility] does not currently have medications on file.  No orders of the defined types were placed in this encounter.    Follow-up: Return in about 1 week (around 08/20/2019).  Scarlette Calico, MD

## 2019-08-14 ENCOUNTER — Encounter: Payer: Self-pay | Admitting: Internal Medicine

## 2019-08-14 ENCOUNTER — Ambulatory Visit
Admission: RE | Admit: 2019-08-14 | Discharge: 2019-08-14 | Disposition: A | Discharge status: Home / Self Care | Payer: No Typology Code available for payment source | Source: Ambulatory Visit | Attending: Internal Medicine | Admitting: Internal Medicine

## 2019-08-14 DIAGNOSIS — R270 Ataxia, unspecified: Secondary | ICD-10-CM

## 2019-08-14 DIAGNOSIS — G3281 Cerebellar ataxia in diseases classified elsewhere: Secondary | ICD-10-CM

## 2019-08-14 LAB — TIQ- AMBIGUOUS ORDER

## 2019-08-14 NOTE — Telephone Encounter (Signed)
Patient scheduled at [facility] imaging 08/15/19  @ 920am

## 2019-08-15 ENCOUNTER — Other Ambulatory Visit: Payer: Self-pay | Admitting: Internal Medicine

## 2019-08-15 ENCOUNTER — Other Ambulatory Visit: Payer: No Typology Code available for payment source

## 2019-08-15 DIAGNOSIS — H8113 Benign paroxysmal vertigo, bilateral: Secondary | ICD-10-CM

## 2019-08-15 DIAGNOSIS — H8110 Benign paroxysmal vertigo, unspecified ear: Secondary | ICD-10-CM | POA: Insufficient documentation

## 2019-08-15 MED ORDER — MECLIZINE HCL 12.5 MG PO TABS: 12.5000 mg | ORAL_TABLET | Freq: Three times a day (TID) | ORAL | 0 refills | Status: DC | PRN

## 2019-08-15 MED FILL — MECLIZINE 25 MG TABLET: 25 | 10 days supply | Qty: 15 | Fill #0

## 2019-08-16 ENCOUNTER — Encounter: Payer: Self-pay | Admitting: Internal Medicine

## 2019-08-16 LAB — THYROID PANEL WITH TSH
Free Thyroxine Index: 1.8 (ref 1.4–3.8)
T3 Uptake: 33 % (ref 22–35)
T4, Total: 5.6 ug/dL (ref 4.9–10.5)
TSH: 1.46 mIU/L (ref 0.40–4.50)

## 2019-08-16 LAB — PAIN MGMT, PROFILE 1 CONF W/O MM, U
Amphetamines: NEGATIVE ng/mL
Barbiturates: NEGATIVE ng/mL
Benzodiazepines: NEGATIVE ng/mL
Cocaine Metabolite: NEGATIVE ng/mL
Creatinine: 123.4 mg/dL
Marijuana Metabolite: NEGATIVE ng/mL
Methadone Metabolite: NEGATIVE ng/mL
Noroxycodone: NEGATIVE ng/mL
Opiates: NEGATIVE ng/mL
Oxidant: NEGATIVE ug/mL
Oxycodone: NEGATIVE ng/mL
Oxycodone: POSITIVE ng/mL
Oxymorphone: 188 ng/mL
Phencyclidine: NEGATIVE ng/mL
pH: 6.5 (ref 4.5–9.0)

## 2019-08-16 LAB — TEST AUTHORIZATION

## 2019-08-16 LAB — PAIN MGMT, ALCOHOL MET. W/CONF, U
Alcohol Metabolites: POSITIVE ng/mL — AB (ref <500)
Ethyl Glucuronide (ETG): 4729 ng/mL
Ethyl Sulfate (ETS): 457 ng/mL

## 2019-08-24 ENCOUNTER — Other Ambulatory Visit: Payer: No Typology Code available for payment source

## 2020-02-12 ENCOUNTER — Encounter: Payer: Self-pay | Admitting: Internal Medicine

## 2020-02-12 NOTE — Telephone Encounter (Signed)
LVM for patient to call back and schedule an OV to assess rash.

## 2020-02-13 ENCOUNTER — Other Ambulatory Visit: Payer: Self-pay

## 2020-02-13 ENCOUNTER — Encounter: Payer: Self-pay | Admitting: Internal Medicine

## 2020-02-13 ENCOUNTER — Ambulatory Visit (INDEPENDENT_AMBULATORY_CARE_PROVIDER_SITE_OTHER): Payer: 59 | Admitting: Internal Medicine

## 2020-02-13 VITALS — BP 144/68 | HR 83 | Temp 99.4°F | Ht 69.0 in | Wt 138.5 lb

## 2020-02-13 DIAGNOSIS — L2389 Allergic contact dermatitis due to other agents: Secondary | ICD-10-CM | POA: Insufficient documentation

## 2020-02-13 MED ORDER — METHYLPREDNISOLONE ACETATE 80 MG/ML IJ SUSP
120.0000 mg | Freq: Once | INTRAMUSCULAR | Status: AC
  Administered 2020-02-13: 120 mg via INTRAMUSCULAR

## 2020-02-13 MED ORDER — FLUOCINONIDE 0.05 % EX OINT: 1.0000 "application " | TOPICAL_OINTMENT | Freq: Two times a day (BID) | CUTANEOUS | 3 refills | Status: AC

## 2020-02-13 MED ORDER — DOXEPIN HCL 10 MG PO CAPS: 10.0000 mg | ORAL_CAPSULE | Freq: Every evening | ORAL | 0 refills | Status: DC | PRN

## 2020-02-13 MED ORDER — DOXEPIN HCL 10 MG PO CAPS: 10.0000 mg | ORAL_CAPSULE | Freq: Every evening | ORAL | 0 refills | Status: AC | PRN

## 2020-02-13 MED FILL — DOXEPIN HCL 10 MG CAPS: 10 | 10 days supply | Qty: 10 | Fill #0

## 2020-02-13 NOTE — Patient Instructions (Signed)
Contact Dermatitis Dermatitis is redness, soreness, and swelling (inflammation) of the skin. Contact dermatitis is a reaction to certain substances that touch the skin. Many different substances can cause contact dermatitis. There are two types of contact dermatitis:  Irritant contact dermatitis. This type is caused by something that irritates your skin, such as having dry hands from washing them too often with soap. This type does not require previous exposure to the substance for a reaction to occur. This is the most common type.  Allergic contact dermatitis. This type is caused by a substance that you are allergic to, such as poison ivy. This type occurs when you have been exposed to the substance (allergen) and develop a sensitivity to it. Dermatitis may develop soon after your first exposure to the allergen, or it may not develop until the next time you are exposed and every time thereafter. What are the causes? Irritant contact dermatitis is most commonly caused by exposure to:  Makeup.  Soaps.  Detergents.  Bleaches.  Acids.  Metal salts, such as nickel. Allergic contact dermatitis is most commonly caused by exposure to:  Poisonous plants.  Chemicals.  Jewelry.  Latex.  Medicines.  Preservatives in products, such as clothing. What increases the risk? You are more likely to develop this condition if you have:  A job that exposes you to irritants or allergens.  Certain medical conditions, such as asthma or eczema. What are the signs or symptoms? Symptoms of this condition may occur on your body anywhere the irritant has touched you or is touched by you.  Symptoms include: ? Dryness or flaking. ? Redness. ? Cracks. ? Itching. ? Pain or a burning feeling. ? Blisters. ? Drainage of small amounts of blood or clear fluid from skin cracks. With allergic contact dermatitis, there may also be swelling in areas such as the eyelids, mouth, or genitals. How is this  diagnosed? This condition is diagnosed with a medical history and physical exam.  A patch skin test may be performed to help determine the cause.  If the condition is related to your job, you may need to see an occupational medicine specialist. How is this treated? This condition is treated by checking for the cause of the reaction and protecting your skin from further contact. Treatment may also include:  Steroid creams or ointments. Oral steroid medicines may be needed in more severe cases.  Antibiotic medicines or antibacterial ointments, if a skin infection is present.  Antihistamine lotion or an antihistamine taken by mouth to ease itching.  A bandage (dressing). Follow these instructions at home: Skin care  Moisturize your skin as needed.  Apply cool compresses to the affected areas.  Try applying baking soda paste to your skin. Stir water into baking soda until it reaches a paste-like consistency.  Do not scratch your skin, and avoid friction to the affected area.  Avoid the use of soaps, perfumes, and dyes. Medicines  Take or apply over-the-counter and prescription medicines only as told by your health care provider.  If you were prescribed an antibiotic medicine, take or apply the antibiotic as told by your health care provider. Do not stop using the antibiotic even if your condition improves. Bathing  Try taking a bath with: ? Epsom salts. Follow the instructions on the packaging. You can get these at your local pharmacy or grocery store. ? Baking soda. Pour a small amount into the bath as directed by your health care provider. ? Colloidal oatmeal. Follow the instructions on the   packaging. You can get this at your local pharmacy or grocery store.  Bathe less frequently, such as every other day.  Bathe in lukewarm water. Avoid using hot water. Bandage care  If you were given a bandage (dressing), change it as told by your health care provider.  Wash your hands  with soap and water before and after you change your dressing. If soap and water are not available, use hand sanitizer. General instructions  Avoid the substance that caused your reaction. If you do not know what caused it, keep a journal to try to track what caused it. Write down: ? What you eat. ? What cosmetic products you use. ? What you drink. ? What you wear in the affected area. This includes jewelry.  Check the affected areas every day for signs of infection. Check for: ? More redness, swelling, or pain. ? More fluid or blood. ? Warmth. ? Pus or a bad smell.  Keep all follow-up visits as told by your health care provider. This is important. Contact a health care provider if:  Your condition does not improve with treatment.  Your condition gets worse.  You have signs of infection such as swelling, tenderness, redness, soreness, or warmth in the affected area.  You have a fever.  You have new symptoms. Get help right away if:  You have a severe headache, neck pain, or neck stiffness.  You vomit.  You feel very sleepy.  You notice red streaks coming from the affected area.  Your bone or joint underneath the affected area becomes painful after the skin has healed.  The affected area turns darker.  You have difficulty breathing. Summary  Dermatitis is redness, soreness, and swelling (inflammation) of the skin. Contact dermatitis is a reaction to certain substances that touch the skin.  Symptoms of this condition may occur on your body anywhere the irritant has touched you or is touched by you.  This condition is treated by figuring out what caused the reaction and protecting your skin from further contact. Treatment may also include medicines and skin care.  Avoid the substance that caused your reaction. If you do not know what caused it, keep a journal to try to track what caused it.  Contact a health care provider if your condition gets worse or you have signs  of infection such as swelling, tenderness, redness, soreness, or warmth in the affected area. This information is not intended to replace advice given to you by your health care provider. Make sure you discuss any questions you have with your health care provider. Document Revised: 10/18/2018 Document Reviewed: 01/11/2018 Elsevier Patient Education  2020 [employer]  

## 2020-02-13 NOTE — Progress Notes (Signed)
Subjective:  Patient ID: Harold Nichols, male    DOB: 23-Jan-1996  Age: 24 y.o. MRN: [id number]  CC: Rash  This visit occurred during the SARS-CoV-2 public health emergency.  Safety protocols were in place, including screening questions prior to the visit, additional usage of staff PPE, and extensive cleaning of exam room while observing appropriate contact time as indicated for disinfecting solutions.    HPI Harold Nichols presents for f/up - He complains of a 3 day hx of pruritic rash on extremities and torso. He has not gotten much symptom relief with a topical antihistamine. He is not aware of any new exposures but he recently vacationed in the [place].  Outpatient Medications Prior to Visit  Medication Sig Dispense Refill  . meclizine (ANTIVERT) 12.5 MG tablet Take 1 tablet (12.5 mg total) by mouth 3 (three) times daily as needed for dizziness. (Patient not taking: Reported on 02/13/2020) 30 tablet 0   No facility-administered medications prior to visit.    ROS Review of Systems  Constitutional: Negative for chills, diaphoresis, fatigue and fever.  HENT: Negative.  Negative for sore throat and trouble swallowing.   Eyes: Negative.   Respiratory: Negative for cough, chest tightness, shortness of breath and wheezing.   Cardiovascular: Negative for chest pain, palpitations and leg swelling.  Gastrointestinal: Negative for abdominal pain, constipation, diarrhea, nausea and vomiting.  Endocrine: Negative.   Genitourinary: Negative.  Negative for difficulty urinating, dysuria, hematuria and urgency.  Musculoskeletal: Negative.   Skin: Positive for rash. Negative for color change.  Neurological: Negative.   Hematological: Negative for adenopathy. Does not bruise/bleed easily.  Psychiatric/Behavioral: Negative.     Objective:  BP (!) 144/68 (BP Location: Left Arm, Patient Position: Sitting, Cuff Size: Normal)   Pulse 83   Temp 99.4 F (37.4 C) (Oral)   Ht 5\' 9"  (1.753 m)   Wt  138 lb 8 oz (62.8 kg)   SpO2 99%   BMI 20.45 kg/m   BP Readings from Last 3 Encounters:  02/13/20 (!) 144/68  08/13/19 124/64  07/28/18 120/70    Wt Readings from Last 3 Encounters:  02/13/20 138 lb 8 oz (62.8 kg)  08/13/19 136 lb (61.7 kg)  07/28/18 149 lb 0.6 oz (67.6 kg)    Physical Exam Vitals reviewed.  Constitutional:      General: He is not in acute distress.    Appearance: He is not ill-appearing.  HENT:     Nose: Nose normal.     Mouth/Throat:     Mouth: Mucous membranes are moist.  Eyes:     General: No scleral icterus.    Conjunctiva/sclera: Conjunctivae normal.  Cardiovascular:     Rate and Rhythm: Normal rate and regular rhythm.     Heart sounds: No murmur heard.   Pulmonary:     Effort: Pulmonary effort is normal.     Breath sounds: No stridor. No wheezing, rhonchi or rales.  Abdominal:     General: Abdomen is flat. Bowel sounds are normal. There is no distension.     Palpations: Abdomen is soft. There is no hepatomegaly, splenomegaly or mass.     Tenderness: There is no abdominal tenderness.  Musculoskeletal:        General: Normal range of motion.     Cervical back: Neck supple.  Lymphadenopathy:     Cervical: No cervical adenopathy.     Upper Body:     Right upper body: No supraclavicular adenopathy.     Left upper body:  No supraclavicular adenopathy.     Lower Body: No right inguinal adenopathy. No left inguinal adenopathy.  Skin:    Findings: Erythema and rash present. No abscess or ecchymosis. Rash is papular. Rash is not macular, urticarial or vesicular.     Comments: TNTC groups and streaks of erythematous papules, macules, patches with some areas of coalescence and scale. See photos.  Neurological:     General: No focal deficit present.     Mental Status: He is alert.     Lab Results  Component Value Date   WBC 8.7 08/13/2019   HGB 15.8 08/13/2019   HCT 44.5 08/13/2019   PLT 169.0 08/13/2019   GLUCOSE 95 08/13/2019   CHOL 169  05/19/2015   TRIG 133.0 05/19/2015   HDL 60.40 05/19/2015   LDLCALC 82 05/19/2015   ALT 11 08/13/2019   AST 16 08/13/2019   NA 141 08/13/2019   K 4.4 08/13/2019   CL 103 08/13/2019   CREATININE 1.07 08/13/2019   BUN 14 08/13/2019   CO2 32 08/13/2019   TSH 1.46 08/13/2019    MR Brain Wo Contrast  Result Date: 08/14/2019 CLINICAL DATA:  Ataxia. Rule out stroke. Dizziness blurred vision 3 days EXAM: MRI HEAD WITHOUT CONTRAST TECHNIQUE: Multiplanar, multiecho pulse sequences of the brain and surrounding structures were obtained without intravenous contrast. COMPARISON:  None. FINDINGS: Brain: No acute infarction, hemorrhage, hydrocephalus, extra-axial collection or mass lesion. Vascular: Normal arterial flow voids Skull and upper cervical spine: Negative Sinuses/Orbits: Mild mucosal edema paranasal sinuses. Negative orbit Other: None IMPRESSION: Normal MRI brain without contrast. Electronically Signed   By: Marlan Palau M.D.   On: 08/14/2019 17:16    Assessment & Plan:   Germaine was seen today for rash.  Diagnoses and all orders for this visit:  Allergic contact dermatitis due to other agents -     Discontinue: doxepin (SINEQUAN) 10 MG capsule; Take 1 capsule (10 mg total) by mouth at bedtime as needed for up to 10 days. -     doxepin (SINEQUAN) 10 MG capsule; Take 1 capsule (10 mg total) by mouth at bedtime as needed for up to 10 days. -     fluocinonide ointment (LIDEX) 0.05 %; Apply 1 application topically 2 (two) times daily. -     methylPREDNISolone acetate (DEPO-MEDROL) injection 120 mg             I have discontinued Anselm Pancoast. Pyon's meclizine. I am also having him start on fluocinonide ointment. Additionally, I am having him maintain his doxepin. We administered methylPREDNISolone acetate.  Meds ordered this encounter  Medications  . DISCONTD: doxepin (SINEQUAN) 10 MG capsule    Sig: Take 1 capsule (10 mg total) by mouth at bedtime as needed for up to 10 days.      Dispense:  10 capsule    Refill:  0  . doxepin (SINEQUAN) 10 MG capsule    Sig: Take 1 capsule (10 mg total) by mouth at bedtime as needed for up to 10 days.    Dispense:  10 capsule    Refill:  0  . fluocinonide ointment (LIDEX) 0.05 %    Sig: Apply 1 application topically 2 (two) times daily.    Dispense:  60 g    Refill:  3  . methylPREDNISolone acetate (DEPO-MEDROL) injection 120 mg     Follow-up: Return in about 2 weeks (around 02/27/2020).  Sanda Linger, MD

## 2020-07-16 IMAGING — MR MR HEAD W/O CM
10 series · 48 of 48 positions shown · non-contrast
Comparison: None.

CLINICAL DATA: Ataxia. Rule out stroke. Dizziness blurred vision 3
days

EXAM:
MRI HEAD WITHOUT CONTRAST
TECHNIQUE: Multiplanar, multiecho pulse sequences of the brain and surrounding
structures were obtained without intravenous contrast.

[Series 2: T1 · sagittal · 5.0mm · 0.45mm/px · 2 of 21 slices shown]
[im 1/21]
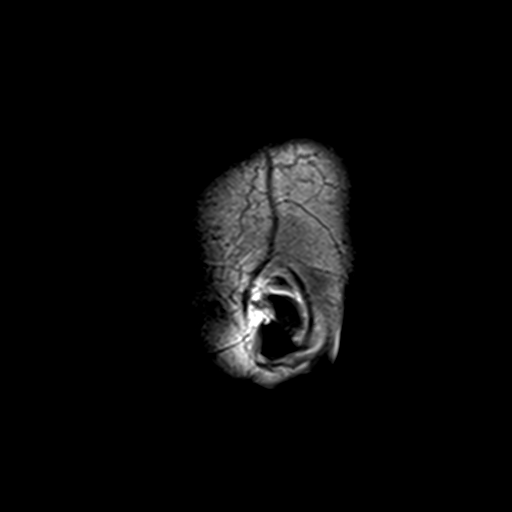
[im 21/21]
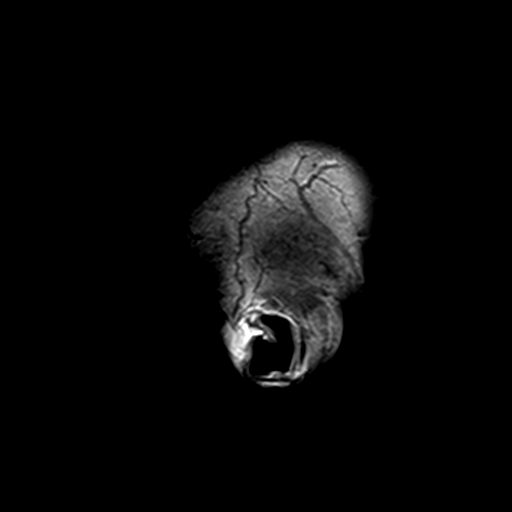

[Series 3: DWI · axial · 3.0mm · 1.80mm/px · z∈[-51,+96]mm · 9 of 99 slices shown (1 of 4)]
[im 1/99]
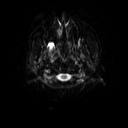
[im 13/99]
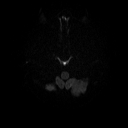
[im 25/99]
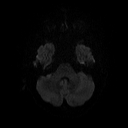
[im 37/99]
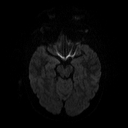
[im 50/99]
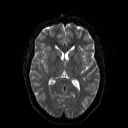
[im 62/99]
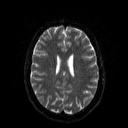
[im 74/99]
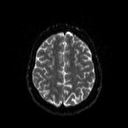
[im 86/99]
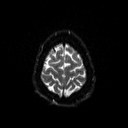
[im 99/99]
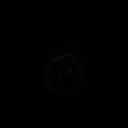

[Series 4: DWI · axial · 3.0mm · 1.80mm/px · z∈[-51,+96]mm · 5 of 50 slices shown (2 of 4)]
[im 1/50]
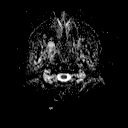
[im 13/50]
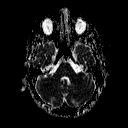
[im 25/50]
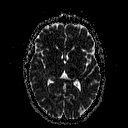
[im 37/50]
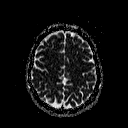
[im 50/50]
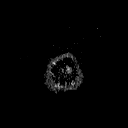

[Series 5: DWI · coronal · 5.0mm · 1.80mm/px · 6 of 67 slices shown (3 of 4)]
[im 1/67]
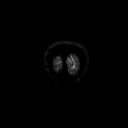
[im 14/67]
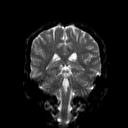
[im 27/67]
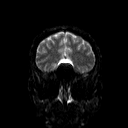
[im 40/67]
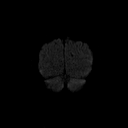
[im 53/67]
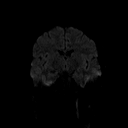
[im 67/67]
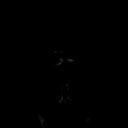

[Series 6: DWI · coronal · 5.0mm · 1.80mm/px · 3 of 34 slices shown (4 of 4)]
[im 1/34]
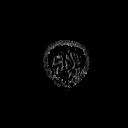
[im 17/34]
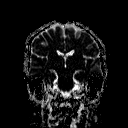
[im 34/34]
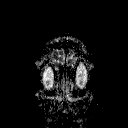

[Series 7: T2 · axial · 5.0mm · 0.60mm/px · z∈[-50,+96]mm · 2 of 22 slices shown (1 of 2)]
[im 1/22]
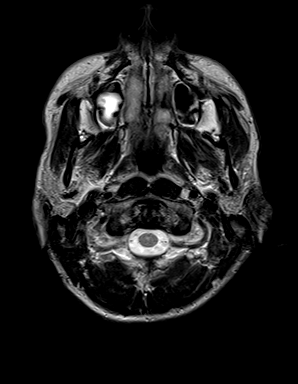
[im 22/22]
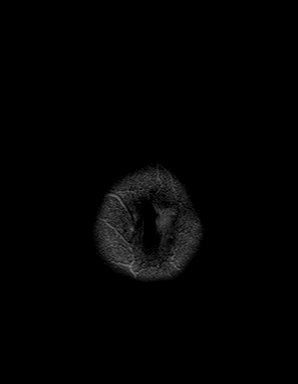

[Series 8: FLAIR · axial · 3.0mm · 0.45mm/px · z∈[-44,+90]mm · 3 of 30 slices shown]
[im 1/30]
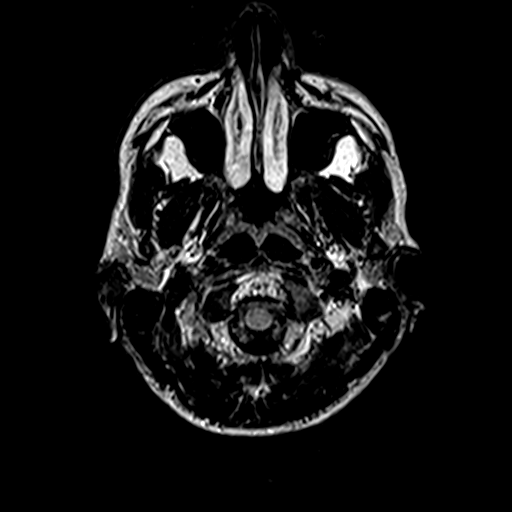
[im 15/30]
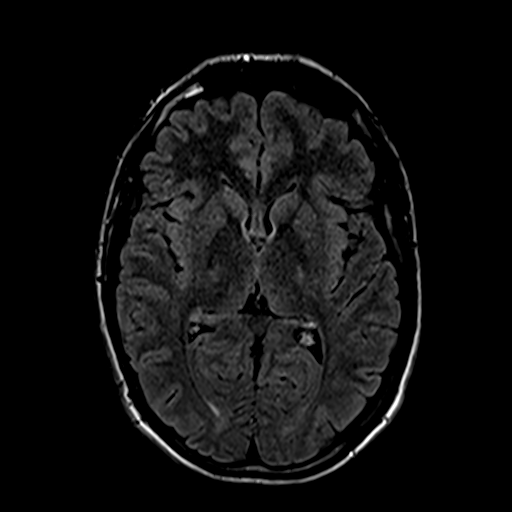
[im 30/30]
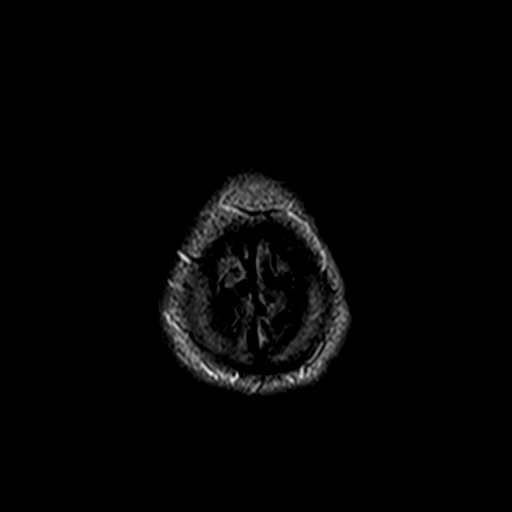

[Series 10: swi_images · axial · 4.0mm · 0.90mm/px · z∈[-47,+93]mm · 3 of 36 slices shown]
[im 1/36]
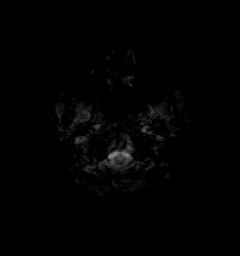
[im 18/36]
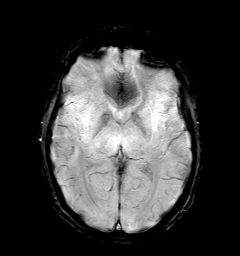
[im 36/36]
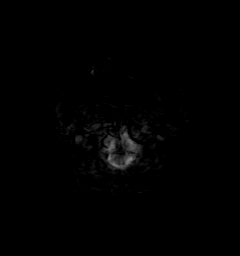

[Series 11: t1_mpr_tra · axial · 1.0mm · 0.71mm/px · z∈[-49,+94]mm · 13 of 144 slices shown]
[im 1/144]
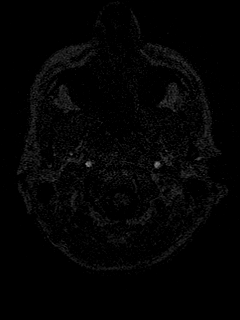
[im 12/144]
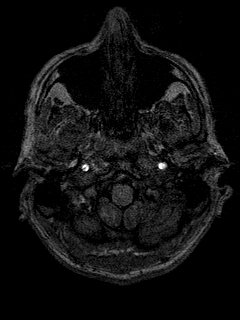
[im 24/144]
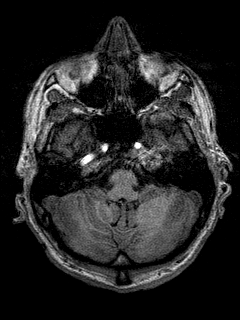
[im 36/144]
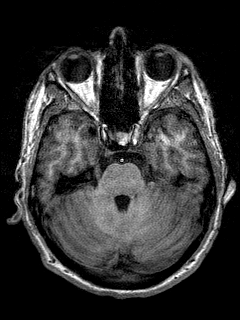
[im 48/144]
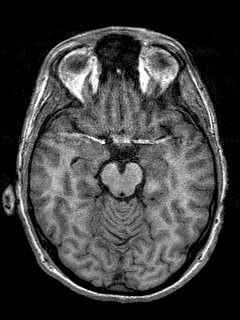
[im 60/144]
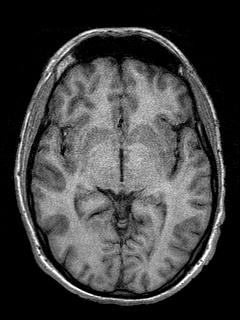
[im 72/144]
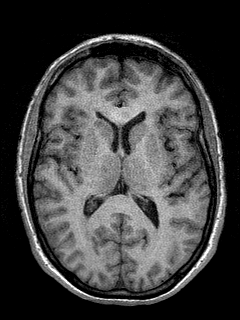
[im 84/144]
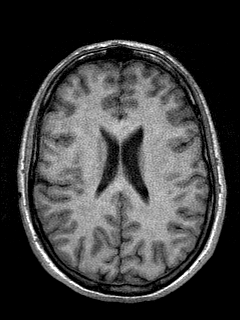
[im 96/144]
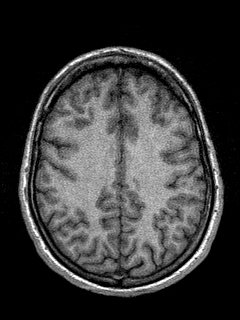
[im 108/144]
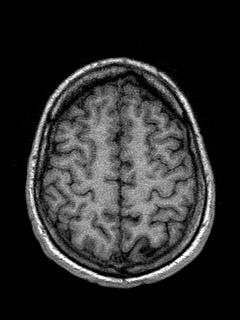
[im 120/144]
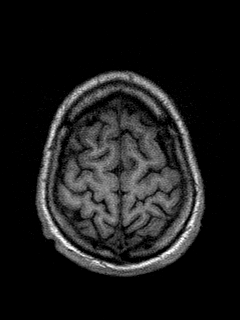
[im 132/144]
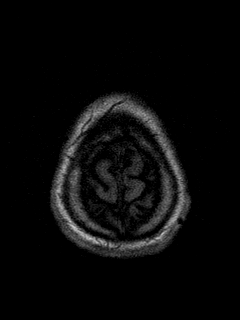
[im 144/144]
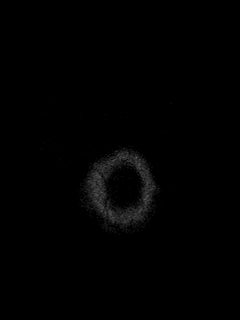

[Series 12: T2 · coronal · 5.0mm · 0.45mm/px · 2 of 25 slices shown (2 of 2)]
[im 1/25]
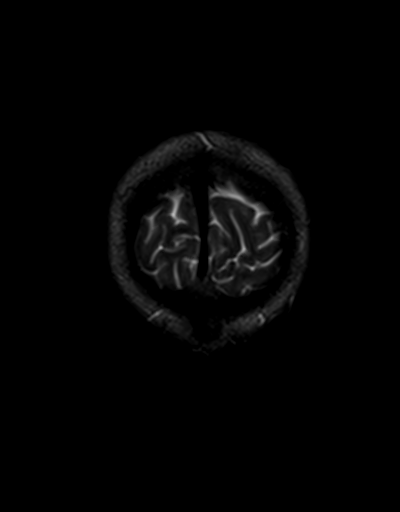
[im 25/25]
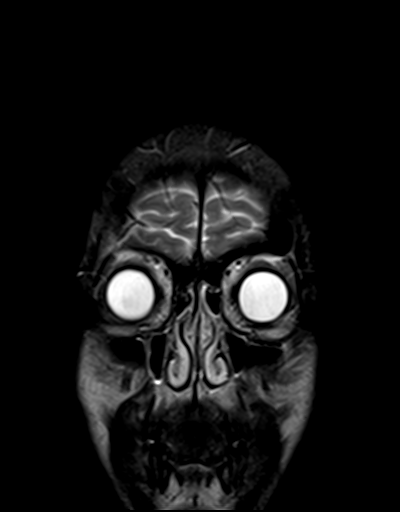

[48 of 48 positions shown; findings below may reference images not displayed]

FINDINGS: Brain: No acute infarction, hemorrhage, hydrocephalus, extra-axial
collection or mass lesion.

Vascular: Normal arterial flow voids

Skull and upper cervical spine: Negative

Sinuses/Orbits: Mild mucosal edema paranasal sinuses. Negative orbit

Other: None
IMPRESSION: Normal MRI brain without contrast.
